# Patient Record
Sex: Female | Born: 1970
Health system: Southern US, Community
[De-identification: ages and names within clinical notes are randomized; demographics above are authoritative.]

## PROBLEM LIST (undated history)

## (undated) DIAGNOSIS — I10 Essential (primary) hypertension: Secondary | ICD-10-CM

## (undated) DIAGNOSIS — N809 Endometriosis, unspecified: Secondary | ICD-10-CM

## (undated) DIAGNOSIS — E049 Nontoxic goiter, unspecified: Secondary | ICD-10-CM

## (undated) DIAGNOSIS — L309 Dermatitis, unspecified: Secondary | ICD-10-CM

## (undated) DIAGNOSIS — J302 Other seasonal allergic rhinitis: Secondary | ICD-10-CM

## (undated) HISTORY — DX: Other seasonal allergic rhinitis: J30.2

## (undated) HISTORY — DX: Dermatitis, unspecified: L30.9

## (undated) HISTORY — DX: Endometriosis, unspecified: N80.9

---

## 2010-07-04 ENCOUNTER — Ambulatory Visit (HOSPITAL_COMMUNITY): Admission: RE | Admit: 2010-07-04 | Discharge: 2010-07-04 | Payer: Self-pay | Admitting: Obstetrics

## 2010-11-19 ENCOUNTER — Inpatient Hospital Stay (HOSPITAL_COMMUNITY)
Admission: AD | Admit: 2010-11-19 | Discharge: 2010-11-19 | Payer: Self-pay | Source: Home / Self Care | Attending: Obstetrics | Admitting: Obstetrics

## 2010-11-20 ENCOUNTER — Inpatient Hospital Stay (HOSPITAL_COMMUNITY)
Admission: AD | Admit: 2010-11-20 | Discharge: 2010-11-20 | Payer: Self-pay | Source: Home / Self Care | Attending: Obstetrics & Gynecology | Admitting: Obstetrics & Gynecology

## 2010-11-27 ENCOUNTER — Inpatient Hospital Stay (HOSPITAL_COMMUNITY)
Admission: RE | Admit: 2010-11-27 | Discharge: 2010-11-30 | Disposition: A | Discharge status: Other Acute Inpatient Hospital | Payer: Self-pay | Source: Home / Self Care | Attending: Obstetrics | Admitting: Obstetrics

## 2010-11-27 ENCOUNTER — Encounter: Payer: Self-pay | Admitting: Obstetrics

## 2010-12-02 LAB — CBC
HCT: 33.8 % — ABNORMAL LOW (ref 36.0–46.0)
HCT: 31.5 % — ABNORMAL LOW (ref 36.0–46.0)
HCT: 27.3 % — ABNORMAL LOW (ref 36.0–46.0)
Hemoglobin: 10.9 g/dL — ABNORMAL LOW (ref 12.0–15.0)
Hemoglobin: 10 g/dL — ABNORMAL LOW (ref 12.0–15.0)
Hemoglobin: 8.7 g/dL — ABNORMAL LOW (ref 12.0–15.0)
MCH: 25.3 pg — ABNORMAL LOW (ref 26.0–34.0)
MCH: 25.3 pg — ABNORMAL LOW (ref 26.0–34.0)
MCH: 25.1 pg — ABNORMAL LOW (ref 26.0–34.0)
MCHC: 32.2 g/dL (ref 30.0–36.0)
MCHC: 31.7 g/dL (ref 30.0–36.0)
MCHC: 31.9 g/dL (ref 30.0–36.0)
MCV: 78.6 fL (ref 78.0–100.0)
MCV: 79.5 fL (ref 78.0–100.0)
MCV: 78.7 fL (ref 78.0–100.0)
Platelets: 216 10*3/uL (ref 150–400)
Platelets: 184 10*3/uL (ref 150–400)
Platelets: 184 10*3/uL (ref 150–400)
RBC: 4.3 MIL/uL (ref 3.87–5.11)
RBC: 3.96 MIL/uL (ref 3.87–5.11)
RBC: 3.47 MIL/uL — ABNORMAL LOW (ref 3.87–5.11)
RDW: 16.4 % — ABNORMAL HIGH (ref 11.5–15.5)
RDW: 16.6 % — ABNORMAL HIGH (ref 11.5–15.5)
RDW: 16.7 % — ABNORMAL HIGH (ref 11.5–15.5)
WBC: 7.9 10*3/uL (ref 4.0–10.5)
WBC: 13.4 10*3/uL — ABNORMAL HIGH (ref 4.0–10.5)
WBC: 13 10*3/uL — ABNORMAL HIGH (ref 4.0–10.5)

## 2010-12-02 LAB — RPR: RPR Ser Ql: NONREACTIVE

## 2010-12-02 LAB — SURGICAL PCR SCREEN
MRSA, PCR: NEGATIVE
Staphylococcus aureus: NEGATIVE

## 2010-12-02 LAB — ABO/RH: ABO/RH(D): O POS

## 2010-12-08 ENCOUNTER — Encounter: Payer: Self-pay | Admitting: Obstetrics

## 2011-01-27 LAB — URINALYSIS, ROUTINE W REFLEX MICROSCOPIC
Bilirubin Urine: NEGATIVE
Glucose, UA: NEGATIVE mg/dL
Ketones, ur: NEGATIVE mg/dL
Nitrite: NEGATIVE
Protein, ur: 100 mg/dL — AB
Specific Gravity, Urine: 1.015 (ref 1.005–1.030)
Urobilinogen, UA: 0.2 mg/dL (ref 0.0–1.0)
pH: 7 (ref 5.0–8.0)

## 2011-01-27 LAB — COMPREHENSIVE METABOLIC PANEL
ALT: 20 U/L (ref 0–35)
AST: 28 U/L (ref 0–37)
Albumin: 2.6 g/dL — ABNORMAL LOW (ref 3.5–5.2)
Alkaline Phosphatase: 156 U/L — ABNORMAL HIGH (ref 39–117)
BUN: 6 mg/dL (ref 6–23)
CO2: 22 mEq/L (ref 19–32)
Calcium: 8.9 mg/dL (ref 8.4–10.5)
Chloride: 103 mEq/L (ref 96–112)
Creatinine, Ser: 0.53 mg/dL (ref 0.4–1.2)
GFR calc Af Amer: >60 mL/min (ref >60)
GFR calc non Af Amer: >60 mL/min (ref >60)
Glucose, Bld: 85 mg/dL (ref 70–99)
Potassium: 3.7 mEq/L (ref 3.5–5.1)
Sodium: 133 mEq/L — ABNORMAL LOW (ref 135–145)
Total Bilirubin: 0.5 mg/dL (ref 0.3–1.2)
Total Protein: 6.7 g/dL (ref 6.0–8.3)

## 2011-01-27 LAB — CBC
HCT: 34.8 % — ABNORMAL LOW (ref 36.0–46.0)
Hemoglobin: 11.1 g/dL — ABNORMAL LOW (ref 12.0–15.0)
MCH: 25.1 pg — ABNORMAL LOW (ref 26.0–34.0)
MCHC: 31.9 g/dL (ref 30.0–36.0)
MCV: 78.7 fL (ref 78.0–100.0)
Platelets: 242 10*3/uL (ref 150–400)
RBC: 4.42 MIL/uL (ref 3.87–5.11)
RDW: 16 % — ABNORMAL HIGH (ref 11.5–15.5)
WBC: 8.2 10*3/uL (ref 4.0–10.5)

## 2011-01-27 LAB — URINE CULTURE
Colony Count: NO GROWTH
Culture  Setup Time: 201201040124
Culture: NO GROWTH

## 2011-01-27 LAB — URINE MICROSCOPIC-ADD ON

## 2011-01-27 LAB — URIC ACID: Uric Acid, Serum: 5.1 mg/dL (ref 2.4–7.0)

## 2011-09-08 ENCOUNTER — Other Ambulatory Visit (HOSPITAL_COMMUNITY): Payer: Self-pay | Admitting: Endocrinology

## 2011-09-08 DIAGNOSIS — E041 Nontoxic single thyroid nodule: Secondary | ICD-10-CM

## 2011-09-15 ENCOUNTER — Other Ambulatory Visit (HOSPITAL_COMMUNITY): Payer: Self-pay

## 2011-09-18 ENCOUNTER — Ambulatory Visit (HOSPITAL_COMMUNITY)
Admission: RE | Admit: 2011-09-18 | Discharge: 2011-09-18 | Disposition: A | Discharge status: Home / Self Care | Payer: BC Managed Care – PPO | Source: Ambulatory Visit | Attending: Endocrinology | Admitting: Endocrinology

## 2011-09-18 DIAGNOSIS — E049 Nontoxic goiter, unspecified: Secondary | ICD-10-CM | POA: Insufficient documentation

## 2011-09-18 DIAGNOSIS — E041 Nontoxic single thyroid nodule: Secondary | ICD-10-CM

## 2011-09-18 DIAGNOSIS — R599 Enlarged lymph nodes, unspecified: Secondary | ICD-10-CM | POA: Insufficient documentation

## 2011-12-29 ENCOUNTER — Other Ambulatory Visit (HOSPITAL_COMMUNITY): Payer: Self-pay | Admitting: Endocrinology

## 2011-12-29 ENCOUNTER — Other Ambulatory Visit: Payer: Self-pay | Admitting: Endocrinology

## 2011-12-29 DIAGNOSIS — E041 Nontoxic single thyroid nodule: Secondary | ICD-10-CM

## 2012-01-09 ENCOUNTER — Ambulatory Visit (HOSPITAL_COMMUNITY)
Admission: RE | Admit: 2012-01-09 | Discharge: 2012-01-09 | Disposition: A | Discharge status: Home / Self Care | Payer: BC Managed Care – PPO | Source: Ambulatory Visit | Attending: Endocrinology | Admitting: Endocrinology

## 2012-01-09 DIAGNOSIS — E041 Nontoxic single thyroid nodule: Secondary | ICD-10-CM | POA: Insufficient documentation

## 2012-01-09 NOTE — Procedures (Signed)
Procedure : FNA of left lower pole thyroid nodule with 25 gauge needle x 4 Complications : none immediate  Sample sent to lab for analysis

## 2012-01-16 IMAGING — US US SOFT TISSUE HEAD/NECK
1 series · 14 of 25 positions shown · non-contrast
Comparison: None.

CLINICAL DATA: Solitary nontoxic goiter on the left.

THYROID ULTRASOUND
TECHNIQUE: Ultrasound examination of the thyroid gland and adjacent
soft tissues was performed.

[Series 1: us soft tissue head/neck · 0.08mm/px · 14 of 53 slices shown]
[im 1/53]
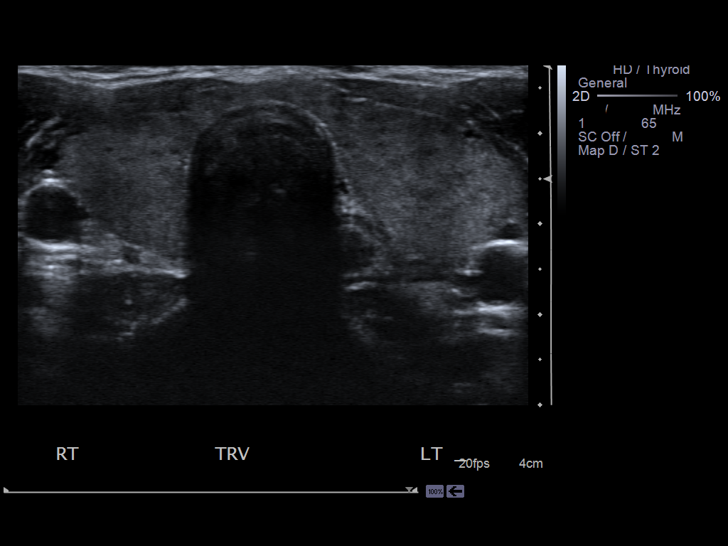
[im 5/53]
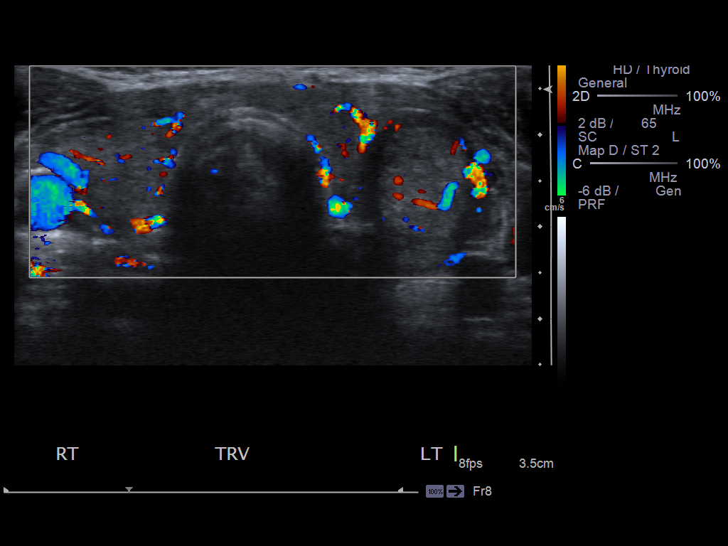
[im 9/53]
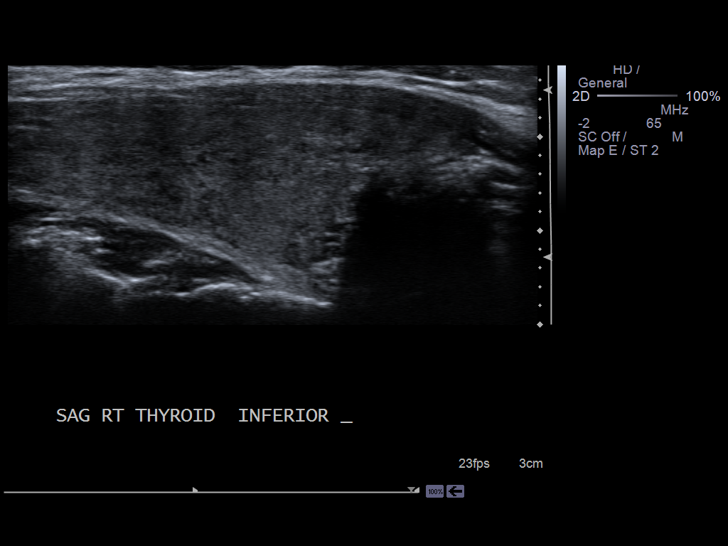
[im 14/53]
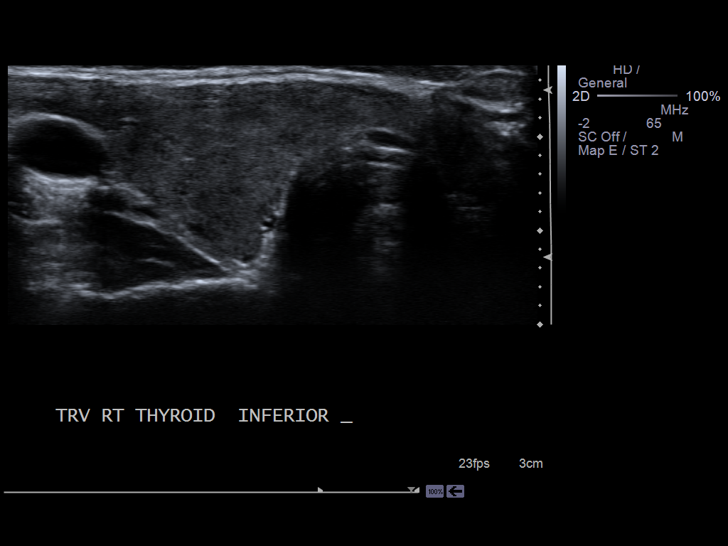
[im 18/53]
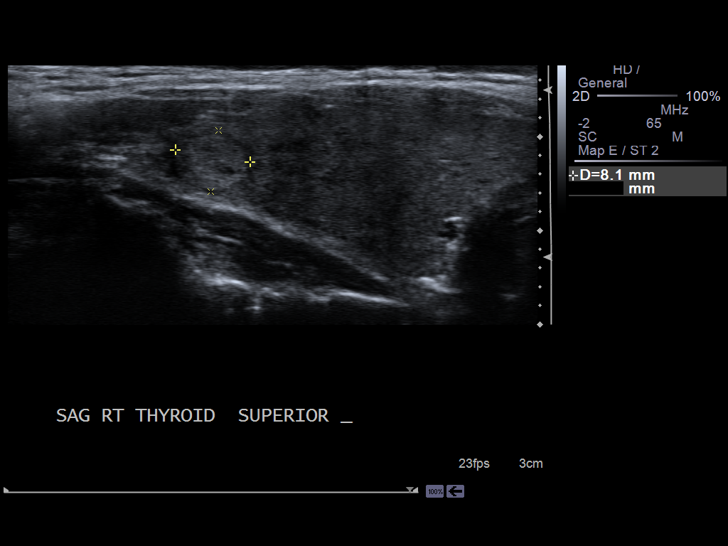
[im 20/53]
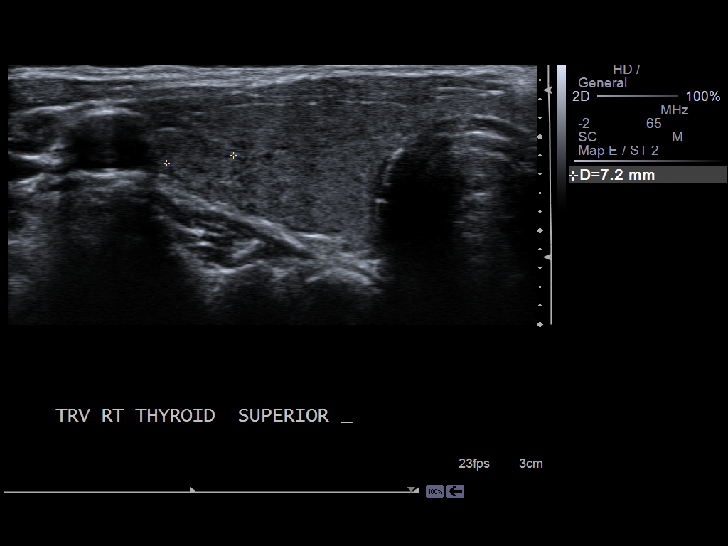
[im 24/53]
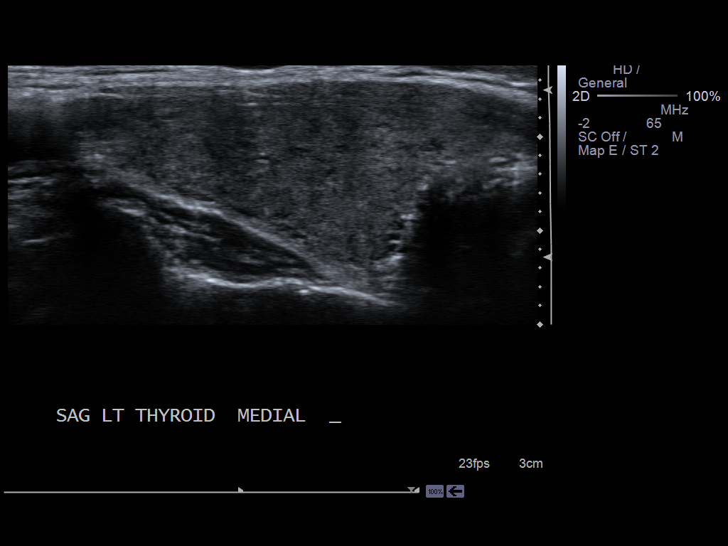
[im 29/53]
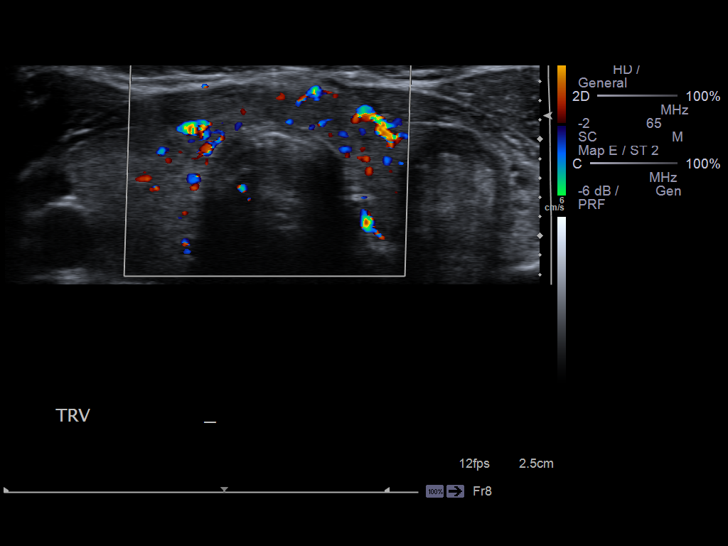
[im 33/53]
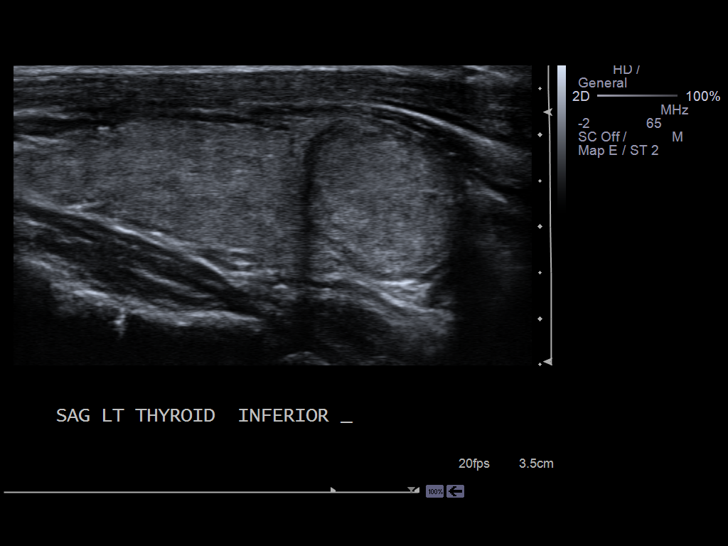
[im 35/53]
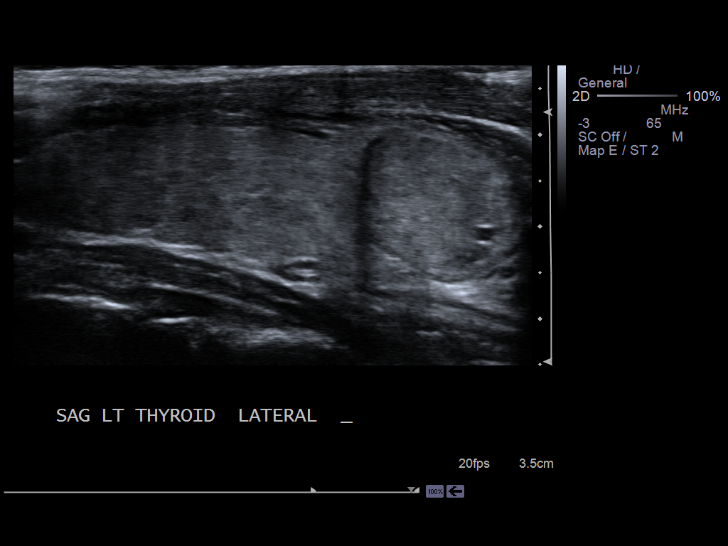
[im 40/53]
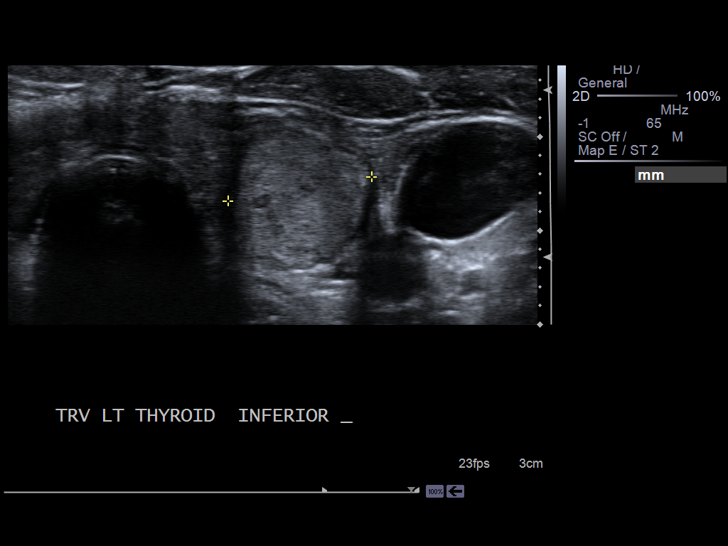
[im 44/53]
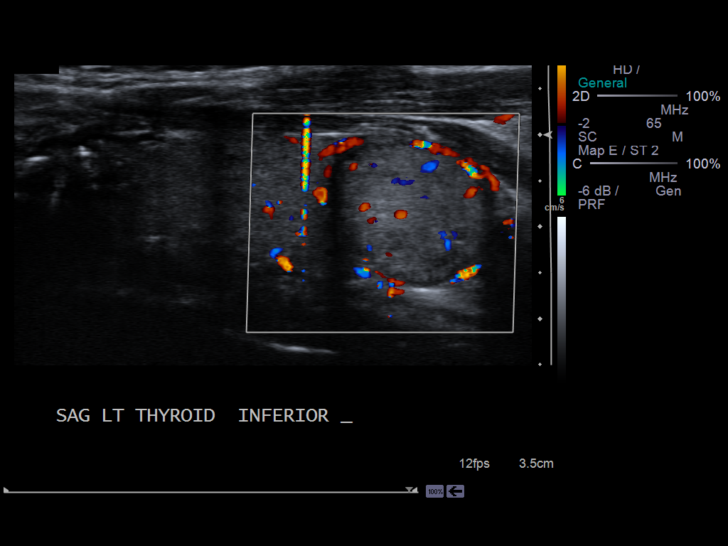
[im 48/53]
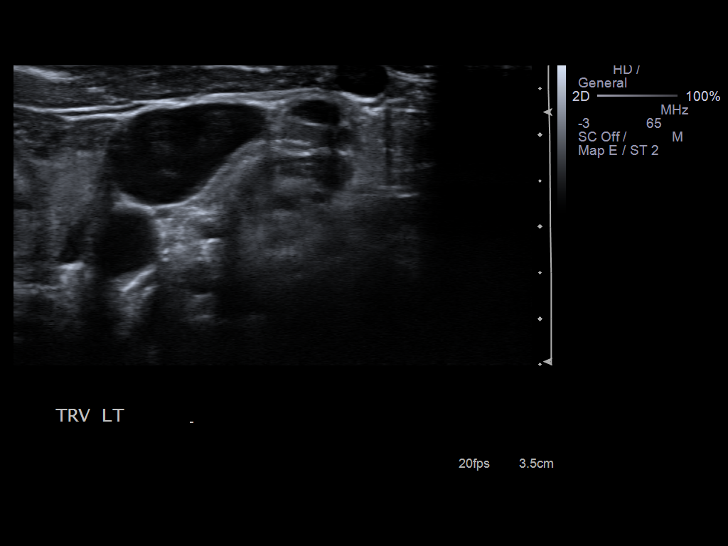
[im 53/53]
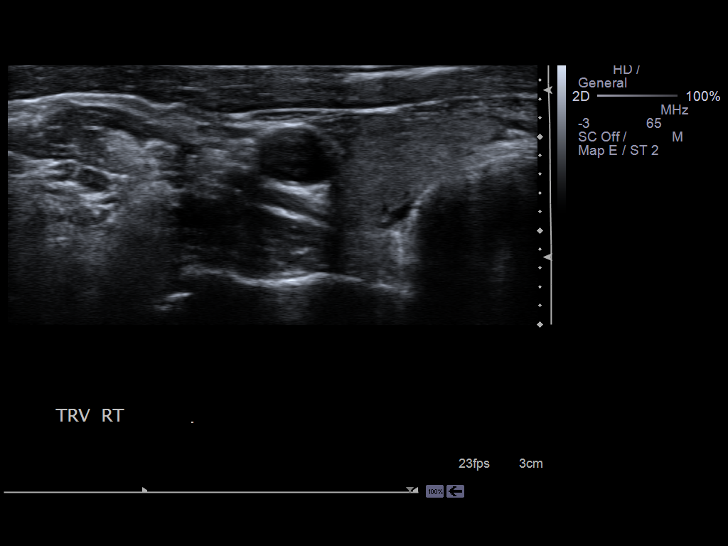

[14 of 25 positions shown; findings below may reference images not displayed]

FINDINGS: Right thyroid lobe:  4.9 x 1.9 x 2.3 cm.
Left thyroid lobe:  5.5 x 1.6 x 2.5 cm.
Isthmus:  0.35 cm.

Focal nodules:  Solid appearing right upper pole 0.8 x 0.7 x 0.7 cm
nodule.

Solid appearing left lower pole 1.9 x 1.7 x 1.6 cm nodule which may
contain small calcifications and tiny cyst.

Lymphadenopathy:  None visualized.
IMPRESSION: Solid lesions within each lobe, larger on the left.  Fine-needle
aspirate may be considered to help exclude malignancy.

## 2013-02-25 ENCOUNTER — Ambulatory Visit: Payer: Self-pay

## 2013-02-25 ENCOUNTER — Other Ambulatory Visit: Payer: Self-pay | Admitting: Occupational Medicine

## 2013-02-25 DIAGNOSIS — R7612 Nonspecific reaction to cell mediated immunity measurement of gamma interferon antigen response without active tuberculosis: Secondary | ICD-10-CM

## 2013-11-24 ENCOUNTER — Other Ambulatory Visit: Payer: Self-pay | Admitting: Endocrinology

## 2013-11-24 DIAGNOSIS — E041 Nontoxic single thyroid nodule: Secondary | ICD-10-CM

## 2013-11-30 ENCOUNTER — Other Ambulatory Visit: Payer: Self-pay

## 2013-12-03 ENCOUNTER — Emergency Department (HOSPITAL_COMMUNITY): Payer: BC Managed Care – PPO

## 2013-12-03 ENCOUNTER — Emergency Department (HOSPITAL_COMMUNITY)
Admission: EM | Admit: 2013-12-03 | Discharge: 2013-12-04 | Disposition: A | Discharge status: Home / Self Care | Payer: BC Managed Care – PPO | Attending: Emergency Medicine | Admitting: Emergency Medicine

## 2013-12-03 ENCOUNTER — Encounter (HOSPITAL_COMMUNITY): Payer: Self-pay | Admitting: Emergency Medicine

## 2013-12-03 DIAGNOSIS — R1905 Periumbilic swelling, mass or lump: Secondary | ICD-10-CM | POA: Insufficient documentation

## 2013-12-03 DIAGNOSIS — R1909 Other intra-abdominal and pelvic swelling, mass and lump: Secondary | ICD-10-CM

## 2013-12-03 DIAGNOSIS — Z3202 Encounter for pregnancy test, result negative: Secondary | ICD-10-CM | POA: Insufficient documentation

## 2013-12-03 DIAGNOSIS — E049 Nontoxic goiter, unspecified: Secondary | ICD-10-CM | POA: Insufficient documentation

## 2013-12-03 DIAGNOSIS — Z9889 Other specified postprocedural states: Secondary | ICD-10-CM | POA: Insufficient documentation

## 2013-12-03 HISTORY — DX: Nontoxic goiter, unspecified: E04.9

## 2013-12-03 LAB — CBC WITH DIFFERENTIAL/PLATELET
BASOS PCT: 1 % (ref 0–1)
Basophils Absolute: 0.1 10*3/uL (ref 0.0–0.1)
EOS ABS: 0.1 10*3/uL (ref 0.0–0.7)
Eosinophils Relative: 2 % (ref 0–5)
HEMATOCRIT: 34.6 % — AB (ref 36.0–46.0)
Hemoglobin: 11.3 g/dL — ABNORMAL LOW (ref 12.0–15.0)
Lymphocytes Relative: 29 % (ref 12–46)
Lymphs Abs: 2.5 10*3/uL (ref 0.7–4.0)
MCH: 25.9 pg — AB (ref 26.0–34.0)
MCHC: 32.7 g/dL (ref 30.0–36.0)
MCV: 79.2 fL (ref 78.0–100.0)
MONO ABS: 1 10*3/uL (ref 0.1–1.0)
Monocytes Relative: 12 % (ref 3–12)
NEUTROS ABS: 5 10*3/uL (ref 1.7–7.7)
Neutrophils Relative %: 57 % (ref 43–77)
Platelets: 267 10*3/uL (ref 150–400)
RBC: 4.37 MIL/uL (ref 3.87–5.11)
RDW: 15.5 % (ref 11.5–15.5)
WBC: 8.8 10*3/uL (ref 4.0–10.5)

## 2013-12-03 LAB — COMPREHENSIVE METABOLIC PANEL
ALBUMIN: 3.6 g/dL (ref 3.5–5.2)
ALK PHOS: 57 U/L (ref 39–117)
ALT: 14 U/L (ref 0–35)
AST: 19 U/L (ref 0–37)
BILIRUBIN TOTAL: 0.2 mg/dL — AB (ref 0.3–1.2)
BUN: 13 mg/dL (ref 6–23)
CHLORIDE: 99 meq/L (ref 96–112)
CO2: 26 mEq/L (ref 19–32)
CREATININE: 0.56 mg/dL (ref 0.50–1.10)
Calcium: 9 mg/dL (ref 8.4–10.5)
GFR calc non Af Amer: >90 mL/min (ref >90)
Glucose, Bld: 112 mg/dL — ABNORMAL HIGH (ref 70–99)
POTASSIUM: 3.7 meq/L (ref 3.7–5.3)
Sodium: 137 mEq/L (ref 137–147)
Total Protein: 8.5 g/dL — ABNORMAL HIGH (ref 6.0–8.3)

## 2013-12-03 LAB — POCT PREGNANCY, URINE: Preg Test, Ur: NEGATIVE

## 2013-12-03 MED ORDER — IOHEXOL 300 MG/ML  SOLN
50.0000 mL | Freq: Once | INTRAMUSCULAR | Status: AC | PRN
  Administered 2013-12-03: 50 mL via ORAL

## 2013-12-03 MED ORDER — DIPHENHYDRAMINE HCL 50 MG/ML IJ SOLN
25.0000 mg | Freq: Once | INTRAMUSCULAR | Status: AC
  Administered 2013-12-03: 25 mg via INTRAVENOUS
  Filled 2013-12-03: qty 1

## 2013-12-03 MED ORDER — METHYLPREDNISOLONE SODIUM SUCC 125 MG IJ SOLR
125.0000 mg | Freq: Once | INTRAMUSCULAR | Status: AC
  Administered 2013-12-03: 125 mg via INTRAVENOUS
  Filled 2013-12-03: qty 2

## 2013-12-03 NOTE — ED Notes (Signed)
Patient unable to urinate at this time. 

## 2013-12-03 NOTE — ED Notes (Signed)
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

## 2013-12-03 NOTE — ED Provider Notes (Signed)
CSN: [id number]     Arrival date & time 12/03/13  2136 History   First MD Initiated Contact with Patient 12/03/13 2211     Chief Complaint  Patient presents with  . Abdominal Pain   (Consider location/radiation/quality/duration/timing/severity/associated sxs/prior Treatment) The history is provided by the patient and medical records. No language interpreter was used.    Alexis Fleming is a 43 y.o. female  with a hx of C-section, thyroid goiter presents to the Emergency Department complaining of gradual, persistent, progressively worsening right lower quadrant abdominal pain onset several months ago.  She reports the pain has been persistent and nagging but acutely worsens over the last 3 days peaking today after a 12 hour shift at work.  Patient reports she had her last cesarean section 2 years ago. Today when the pain was at its worst she was palpating the incision and palpated a mass in the right lower quadrant.  Patient reports family history of colon cancer. She reports her mother died at the age of 72 with colon cancer this is her concern.  Nothing makes symptoms better or worse. She's not tried any over-the-counter treatments. She denies fever, chills, headache, neck pain, chest pain shortness of breath, nausea, vomiting, diarrhea, weakness, dizziness, syncope, dysuria, hematuria. She reports her last bowel movement was today and was normal.    Past Medical History  Diagnosis Date  . Thyroid goiter     bipsy done here 2013   Past Surgical History  Procedure Laterality Date  . Cesarean section     No family history on file. History  Substance Use Topics  . Smoking status: Never Smoker   . Smokeless tobacco: Not on file  . Alcohol Use: No   OB History   Grav Para Term Preterm Abortions TAB SAB Ect Mult Living                 Review of Systems  Constitutional: Negative for fever, diaphoresis, appetite change, fatigue and unexpected weight change.  HENT: Negative for mouth  sores.   Eyes: Negative for visual disturbance.  Respiratory: Negative for cough, chest tightness, shortness of breath and wheezing.   Cardiovascular: Negative for chest pain.  Gastrointestinal: Positive for abdominal pain. Negative for nausea, vomiting, diarrhea and constipation.  Endocrine: Negative for polydipsia, polyphagia and polyuria.  Genitourinary: Negative for dysuria, urgency, frequency and hematuria.  Musculoskeletal: Negative for back pain and neck stiffness.  Skin: Negative for rash.  Allergic/Immunologic: Negative for immunocompromised state.  Neurological: Negative for syncope, light-headedness and headaches.  Hematological: Does not bruise/bleed easily.  Psychiatric/Behavioral: Negative for sleep disturbance. The patient is not nervous/anxious.     Allergies  Shellfish allergy and Betadine  Home Medications  No current outpatient prescriptions on file. BP 142/78  Pulse 93  Temp(Src) 98.2 F (36.8 C) (Oral)  Resp 15  Ht 5\' 4"  (1.626 m)  Wt 152 lb 9.6 oz (69.219 kg)  BMI 26.18 kg/m2  SpO2 99%  LMP 11/18/2013 Physical Exam  Nursing note and vitals reviewed. Constitutional: She is oriented to person, place, and time. She appears well-developed and well-nourished.  HENT:  Head: Normocephalic and atraumatic.  Mouth/Throat: Oropharynx is clear and moist.  Eyes: Conjunctivae are normal. No scleral icterus.  Neck: Normal range of motion. Thyromegaly (palpable small goiter) present.  Cardiovascular: Normal rate, regular rhythm, normal heart sounds and intact distal pulses.   No murmur heard. No tachycardia  Pulmonary/Chest: Effort normal and breath sounds normal. No respiratory distress. She has no wheezes.  Abdominal: Soft. Bowel sounds are normal. She exhibits no distension and no mass. There is tenderness in the periumbilical area. There is guarding. There is no rebound and no CVA tenderness.  Periumbilical abdominal tenderness with 4 x 4 cm palpable mass at the  7' O'Clock position in the periumbilical region.   Guarding but no rebound; no peritoneal signs  Musculoskeletal: Normal range of motion. She exhibits no tenderness.  Lymphadenopathy:    She has no cervical adenopathy.  Neurological: She is alert and oriented to person, place, and time. She exhibits normal muscle tone. Coordination normal.  Skin: Skin is warm and dry. No erythema.  Psychiatric: She has a normal mood and affect. Her behavior is normal.    ED Course  Procedures (including critical care time) Labs Review Labs Reviewed  CBC WITH DIFFERENTIAL - Abnormal; Notable for the following:    Hemoglobin 11.3 (*)    HCT 34.6 (*)    MCH 25.9 (*)    All other components within normal limits  COMPREHENSIVE METABOLIC PANEL - Abnormal; Notable for the following:    Glucose, Bld 112 (*)    Total Protein 8.5 (*)    Total Bilirubin 0.2 (*)    All other components within normal limits  URINALYSIS, ROUTINE W REFLEX MICROSCOPIC  POCT PREGNANCY, URINE   Imaging Review No results found.  EKG Interpretation   None       MDM   1. Abdominal pain      ISOBELLA ASCHER presents with periumbilical and right lower quadrant pain for many months, now with small mass right lower in the region. CBC, CMP unremarkable.  UA without evidence of urinary tract infection. Pregnancy test negative.  No pelvic pain on exam.  1:06 AM Pt CT pending.  She continues to refuse narcotic pain medication, but as given Toradol.  Discussed with Barton Dubois, PA-C who will follow and dispo.    BP 142/78  Pulse 93  Temp(Src) 98.2 F (36.8 C) (Oral)  Resp 15  Ht 5\' 4"  (1.626 m)  Wt 152 lb 9.6 oz (69.219 kg)  BMI 26.18 kg/m2  SpO2 99%  LMP 11/18/2013   Abigail Butts, PA-C 12/04/13 0106

## 2013-12-04 LAB — URINALYSIS, ROUTINE W REFLEX MICROSCOPIC
Bilirubin Urine: NEGATIVE
Glucose, UA: NEGATIVE mg/dL
Hgb urine dipstick: NEGATIVE
Ketones, ur: NEGATIVE mg/dL
Leukocytes, UA: NEGATIVE
Nitrite: NEGATIVE
Protein, ur: NEGATIVE mg/dL
Specific Gravity, Urine: 1.014 (ref 1.005–1.030)
Urobilinogen, UA: 0.2 mg/dL (ref 0.0–1.0)
pH: 6.5 (ref 5.0–8.0)

## 2013-12-04 MED ORDER — IOHEXOL 300 MG/ML  SOLN
100.0000 mL | Freq: Once | INTRAMUSCULAR | Status: AC | PRN
  Administered 2013-12-04: 100 mL via INTRAVENOUS

## 2013-12-04 MED ORDER — KETOROLAC TROMETHAMINE 30 MG/ML IJ SOLN
30.0000 mg | Freq: Once | INTRAMUSCULAR | Status: AC
  Administered 2013-12-04: 30 mg via INTRAVENOUS
  Filled 2013-12-04: qty 1

## 2013-12-04 NOTE — ED Provider Notes (Signed)
Medical screening examination/treatment/procedure(s) were performed by non-physician practitioner and as supervising physician I was immediately available for consultation/collaboration.  EKG Interpretation   None         Kadeisha Betsch T Abe Schools, MD 12/04/13 1504 

## 2013-12-04 NOTE — Discharge Instructions (Signed)
Follow up with the general surgeon for further evaluation of mass in abdominal wall fat.  Follow up with gastroenterology for colonoscopy and your primary care doctor to discuss lung findings on Cat Scan.  Return to the ER if you develop fever, your pain worsens or you have any other concerning symptoms.

## 2013-12-04 NOTE — ED Notes (Signed)
PA at bedside.

## 2013-12-16 ENCOUNTER — Other Ambulatory Visit: Payer: Self-pay

## 2013-12-16 ENCOUNTER — Ambulatory Visit (INDEPENDENT_AMBULATORY_CARE_PROVIDER_SITE_OTHER): Payer: BC Managed Care – PPO | Admitting: General Surgery

## 2013-12-23 ENCOUNTER — Ambulatory Visit
Admission: RE | Admit: 2013-12-23 | Discharge: 2013-12-23 | Disposition: A | Discharge status: Home / Self Care | Payer: BC Managed Care – PPO | Source: Ambulatory Visit | Attending: Endocrinology | Admitting: Endocrinology

## 2013-12-23 DIAGNOSIS — E041 Nontoxic single thyroid nodule: Secondary | ICD-10-CM

## 2013-12-28 ENCOUNTER — Ambulatory Visit (INDEPENDENT_AMBULATORY_CARE_PROVIDER_SITE_OTHER): Payer: BC Managed Care – PPO | Admitting: General Surgery

## 2013-12-28 ENCOUNTER — Encounter (INDEPENDENT_AMBULATORY_CARE_PROVIDER_SITE_OTHER): Payer: Self-pay | Admitting: General Surgery

## 2013-12-28 VITALS — BP 122/68 | HR 72 | Temp 98.0°F | Resp 18 | Ht 64.0 in | Wt 150.0 lb

## 2013-12-28 DIAGNOSIS — R1903 Right lower quadrant abdominal swelling, mass and lump: Secondary | ICD-10-CM

## 2013-12-28 DIAGNOSIS — R1901 Right upper quadrant abdominal swelling, mass and lump: Secondary | ICD-10-CM | POA: Insufficient documentation

## 2013-12-28 NOTE — Progress Notes (Signed)
Patient ID: Alexis Fleming, female   DOB: 24-Mar-1971, 43 y.o.   MRN: [id number]  Chief Complaint  Patient presents with  . New Evaluation    wound    HPI Alexis Fleming is a 43 y.o. female.  We're asked to see the patient in consultation by Dr. Sherwood Gambler to evaluate her for an abdominal wall mass. The patient is a 43 year old female who first felt a mass in her right lower quadrant about 2 months ago. The pain has gotten worse since she first noticed it. She feels as though the mass is gotten a little bit larger since she first noticed it. She denies any nausea or vomiting. She denies any fevers or chills. Her appetite is good no bowels are working normally. She had a CT scan that shows a 1.6 cm mass in the subcutaneous tissue just on top of the abdominal wall muscles in the right lower quadrant.  HPI  Past Medical History  Diagnosis Date  . Thyroid goiter     bipsy done here 2013    Past Surgical History  Procedure Laterality Date  . Cesarean section  2003/2012    Family History  Problem Relation Age of Onset  . Cancer Mother     colon ca  . Heart disease Father     Social History History  Substance Use Topics  . Smoking status: Never Smoker   . Smokeless tobacco: Not on file  . Alcohol Use: No    Allergies  Allergen Reactions  . Shellfish Allergy Anaphylaxis and Swelling  . Betadine [Povidone Iodine] Rash    Leaves rash on body    Current Outpatient Prescriptions  Medication Sig Dispense Refill  . ibuprofen (ADVIL,MOTRIN) 200 MG tablet Take 200 mg by mouth every 6 (six) hours as needed.       No current facility-administered medications for this visit.    Review of Systems Review of Systems  Constitutional: Negative.   HENT: Negative.   Eyes: Negative.   Respiratory: Negative.   Cardiovascular: Negative.   Gastrointestinal: Negative.   Endocrine: Negative.   Genitourinary: Negative.   Musculoskeletal: Negative.   Skin: Negative.    Allergic/Immunologic: Negative.   Neurological: Negative.   Hematological: Negative.   Psychiatric/Behavioral: Negative.     Blood pressure 122/68, pulse 72, temperature 98 F (36.7 C), resp. rate 18, height 5\' 4"  (1.626 m), weight 150 lb (68.04 kg), last menstrual period 11/18/2013.  Physical Exam Physical Exam  Constitutional: She is oriented to person, place, and time. She appears well-developed and well-nourished.  HENT:  Head: Normocephalic and atraumatic.  Eyes: Conjunctivae and EOM are normal. Pupils are equal, round, and reactive to light.  Neck: Normal range of motion. Neck supple.  Cardiovascular: Normal rate, regular rhythm and normal heart sounds.   Pulmonary/Chest: Effort normal and breath sounds normal.  Abdominal: Soft. Bowel sounds are normal.  There is a palpable mass that feels as though it is about 1-1/2 cm in the right lower quadrant just superior to the edge of her C-section incision  Musculoskeletal: Normal range of motion.  Lymphadenopathy:    She has no cervical adenopathy.  Neurological: She is alert and oriented to person, place, and time.  Skin: Skin is warm and dry.  Psychiatric: She has a normal mood and affect. Her behavior is normal.    Data Reviewed As above  Assessment    The patient appears to have an abdominal wall mass and a right lower quadrant that is painful  and enlarging. Because of this I think it would be reasonable to remove this. I discussed with her in detail the risks and benefits of the operation to remove this mass as well as some of the technical aspects and she understands and wishes to proceed     Plan    Plan for excision of the right lower quadrant abdominal wall mass        TOTH III,PAUL S 12/28/2013, 2:25 PM

## 2014-01-12 ENCOUNTER — Encounter (HOSPITAL_COMMUNITY): Payer: Self-pay | Admitting: *Deleted

## 2014-01-12 NOTE — Progress Notes (Signed)
Patient uses for pharmacy [facility] on [address] in [place].

## 2014-01-19 NOTE — Anesthesia Preprocedure Evaluation (Addendum)
Anesthesia Evaluation  Patient identified by MRN, date of birth, ID band Patient awake    Reviewed: Allergy & Precautions, H&P , NPO status , Patient's Chart, lab work & pertinent test results  Airway Mallampati: II TM Distance: >3 FB Neck ROM: Full    Dental  (+) Teeth Intact, Dental Advisory Given   Pulmonary neg pulmonary ROS,  breath sounds clear to auscultation  Pulmonary exam normal       Cardiovascular hypertension, Rhythm:Regular Rate:Normal     Neuro/Psych negative neurological ROS  negative psych ROS   GI/Hepatic negative GI ROS, Neg liver ROS,   Endo/Other  negative endocrine ROS  Renal/GU negative Renal ROS  negative genitourinary   Musculoskeletal negative musculoskeletal ROS (+)   Abdominal   Peds  Hematology negative hematology ROS (+)   Anesthesia Other Findings   Reproductive/Obstetrics                          Anesthesia Physical Anesthesia Plan  ASA: II  Anesthesia Plan: General   Post-op Pain Management:    Induction: Intravenous  Airway Management Planned: Oral ETT and LMA  Additional Equipment:   Intra-op Plan:   Post-operative Plan: Extubation in OR  Informed Consent: I have reviewed the patients History and Physical, chart, labs and discussed the procedure including the risks, benefits and alternatives for the proposed anesthesia with the patient or authorized representative who has indicated his/her understanding and acceptance.   Dental advisory given  Plan Discussed with: CRNA  Anesthesia Plan Comments:        Anesthesia Quick Evaluation

## 2014-01-20 ENCOUNTER — Encounter (HOSPITAL_COMMUNITY): Payer: BC Managed Care – PPO | Admitting: Certified Registered Nurse Anesthetist

## 2014-01-20 ENCOUNTER — Encounter (HOSPITAL_COMMUNITY): Payer: Self-pay | Admitting: *Deleted

## 2014-01-20 ENCOUNTER — Encounter (HOSPITAL_COMMUNITY)
Admission: RE | Disposition: A | Discharge status: Home / Self Care | Payer: Self-pay | Source: Ambulatory Visit | Attending: General Surgery

## 2014-01-20 ENCOUNTER — Ambulatory Visit (HOSPITAL_COMMUNITY): Payer: BC Managed Care – PPO | Admitting: Certified Registered Nurse Anesthetist

## 2014-01-20 ENCOUNTER — Ambulatory Visit (HOSPITAL_COMMUNITY)
Admission: RE | Admit: 2014-01-20 | Discharge: 2014-01-20 | Disposition: A | Discharge status: Home / Self Care | Payer: BC Managed Care – PPO | Source: Ambulatory Visit | Attending: General Surgery | Admitting: General Surgery

## 2014-01-20 DIAGNOSIS — R1903 Right lower quadrant abdominal swelling, mass and lump: Secondary | ICD-10-CM

## 2014-01-20 DIAGNOSIS — N806 Endometriosis in cutaneous scar: Secondary | ICD-10-CM

## 2014-01-20 DIAGNOSIS — I10 Essential (primary) hypertension: Secondary | ICD-10-CM | POA: Insufficient documentation

## 2014-01-20 DIAGNOSIS — R1909 Other intra-abdominal and pelvic swelling, mass and lump: Secondary | ICD-10-CM | POA: Insufficient documentation

## 2014-01-20 DIAGNOSIS — N808 Other endometriosis: Secondary | ICD-10-CM | POA: Insufficient documentation

## 2014-01-20 HISTORY — DX: Essential (primary) hypertension: I10

## 2014-01-20 HISTORY — PX: IRRIGATION AND DEBRIDEMENT ABSCESS: SHX5252

## 2014-01-20 LAB — CBC
HCT: 34.7 % — ABNORMAL LOW (ref 36.0–46.0)
Hemoglobin: 11.5 g/dL — ABNORMAL LOW (ref 12.0–15.0)
MCH: 26.6 pg (ref 26.0–34.0)
MCHC: 33.1 g/dL (ref 30.0–36.0)
MCV: 80.1 fL (ref 78.0–100.0)
Platelets: 258 10*3/uL (ref 150–400)
RBC: 4.33 MIL/uL (ref 3.87–5.11)
RDW: 15.7 % — AB (ref 11.5–15.5)
WBC: 6.7 10*3/uL (ref 4.0–10.5)

## 2014-01-20 LAB — HCG, SERUM, QUALITATIVE: PREG SERUM: NEGATIVE

## 2014-01-20 SURGERY — IRRIGATION AND DEBRIDEMENT ABSCESS
Anesthesia: General | Site: Abdomen | Laterality: Right

## 2014-01-20 MED ORDER — BUPIVACAINE-EPINEPHRINE 0.25% -1:200000 IJ SOLN
INTRAMUSCULAR | Status: DC | PRN
  Administered 2014-01-20: 20 mL

## 2014-01-20 MED ORDER — 0.9 % SODIUM CHLORIDE (POUR BTL) OPTIME
TOPICAL | Status: DC | PRN
  Administered 2014-01-20: 1000 mL

## 2014-01-20 MED ORDER — SODIUM CHLORIDE 0.9 % IJ SOLN
INTRAMUSCULAR | Status: AC
  Filled 2014-01-20: qty 10

## 2014-01-20 MED ORDER — LACTATED RINGERS IV SOLN: INTRAVENOUS | Status: DC

## 2014-01-20 MED ORDER — LIDOCAINE HCL (CARDIAC) 20 MG/ML IV SOLN
INTRAVENOUS | Status: DC | PRN
  Administered 2014-01-20: 70 mg via INTRAVENOUS

## 2014-01-20 MED ORDER — PROPOFOL 10 MG/ML IV BOLUS
INTRAVENOUS | Status: AC
Start: 2014-01-20 — End: 2014-01-20
  Filled 2014-01-20: qty 20

## 2014-01-20 MED ORDER — CHLORHEXIDINE GLUCONATE 4 % EX LIQD: 1.0000 "application " | Freq: Once | CUTANEOUS | Status: DC

## 2014-01-20 MED ORDER — OXYCODONE-ACETAMINOPHEN 5-325 MG PO TABS
1.0000 | ORAL_TABLET | Freq: Once | ORAL | Status: AC
  Administered 2014-01-20: 1 via ORAL
  Filled 2014-01-20: qty 1

## 2014-01-20 MED ORDER — LACTATED RINGERS IV SOLN
INTRAVENOUS | Status: DC | PRN
  Administered 2014-01-20 (×2): via INTRAVENOUS

## 2014-01-20 MED ORDER — ATROPINE SULFATE 0.4 MG/ML IJ SOLN
INTRAMUSCULAR | Status: AC
  Filled 2014-01-20: qty 1

## 2014-01-20 MED ORDER — MIDAZOLAM HCL 5 MG/5ML IJ SOLN
INTRAMUSCULAR | Status: DC | PRN
  Administered 2014-01-20: 2 mg via INTRAVENOUS

## 2014-01-20 MED ORDER — CEFAZOLIN SODIUM-DEXTROSE 2-3 GM-% IV SOLR
2.0000 g | INTRAVENOUS | Status: AC
  Administered 2014-01-20: 2 g via INTRAVENOUS

## 2014-01-20 MED ORDER — DEXAMETHASONE SODIUM PHOSPHATE 10 MG/ML IJ SOLN
INTRAMUSCULAR | Status: DC | PRN
  Administered 2014-01-20: 10 mg via INTRAVENOUS

## 2014-01-20 MED ORDER — BUPIVACAINE-EPINEPHRINE PF 0.25-1:200000 % IJ SOLN
INTRAMUSCULAR | Status: AC
  Filled 2014-01-20: qty 30

## 2014-01-20 MED ORDER — FENTANYL CITRATE 0.05 MG/ML IJ SOLN
INTRAMUSCULAR | Status: AC
  Filled 2014-01-20: qty 5

## 2014-01-20 MED ORDER — ONDANSETRON HCL 4 MG/2ML IJ SOLN
INTRAMUSCULAR | Status: AC
  Filled 2014-01-20: qty 2

## 2014-01-20 MED ORDER — CEFAZOLIN SODIUM-DEXTROSE 2-3 GM-% IV SOLR
INTRAVENOUS | Status: AC
  Filled 2014-01-20: qty 50

## 2014-01-20 MED ORDER — EPHEDRINE SULFATE 50 MG/ML IJ SOLN
INTRAMUSCULAR | Status: AC
  Filled 2014-01-20: qty 1

## 2014-01-20 MED ORDER — LIDOCAINE HCL 1 % IJ SOLN
INTRAMUSCULAR | Status: AC
  Filled 2014-01-20: qty 20

## 2014-01-20 MED ORDER — HYDROMORPHONE HCL PF 1 MG/ML IJ SOLN: 0.2500 mg | INTRAMUSCULAR | Status: DC | PRN

## 2014-01-20 MED ORDER — DEXAMETHASONE SODIUM PHOSPHATE 10 MG/ML IJ SOLN
INTRAMUSCULAR | Status: AC
  Filled 2014-01-20: qty 1

## 2014-01-20 MED ORDER — PROMETHAZINE HCL 25 MG/ML IJ SOLN: 6.2500 mg | INTRAMUSCULAR | Status: DC | PRN

## 2014-01-20 MED ORDER — PROPOFOL 10 MG/ML IV BOLUS
INTRAVENOUS | Status: AC
  Filled 2014-01-20: qty 20

## 2014-01-20 MED ORDER — OXYCODONE-ACETAMINOPHEN 5-325 MG PO TABS: 1.0000 | ORAL_TABLET | ORAL | Status: DC | PRN

## 2014-01-20 MED ORDER — MIDAZOLAM HCL 2 MG/2ML IJ SOLN
INTRAMUSCULAR | Status: AC
  Filled 2014-01-20: qty 2

## 2014-01-20 MED ORDER — PROPOFOL 10 MG/ML IV BOLUS
INTRAVENOUS | Status: DC | PRN
  Administered 2014-01-20: 180 mg via INTRAVENOUS

## 2014-01-20 MED ORDER — FENTANYL CITRATE 0.05 MG/ML IJ SOLN
INTRAMUSCULAR | Status: DC | PRN
  Administered 2014-01-20 (×2): 50 ug via INTRAVENOUS

## 2014-01-20 MED ORDER — ONDANSETRON HCL 4 MG/2ML IJ SOLN
INTRAMUSCULAR | Status: DC | PRN
  Administered 2014-01-20: 4 mg via INTRAVENOUS

## 2014-01-20 MED ORDER — LIDOCAINE HCL (CARDIAC) 20 MG/ML IV SOLN
INTRAVENOUS | Status: AC
  Filled 2014-01-20: qty 5

## 2014-01-20 SURGICAL SUPPLY — 39 items
BENZOIN TINCTURE PRP APPL 2/3 (GAUZE/BANDAGES/DRESSINGS) IMPLANT
BLADE HEX COATED 2.75 (ELECTRODE) ×2 IMPLANT
BLADE SURG SZ10 CARB STEEL (BLADE) ×2 IMPLANT
CANISTER SUCTION 2500CC (MISCELLANEOUS) ×2 IMPLANT
DECANTER SPIKE VIAL GLASS SM (MISCELLANEOUS) IMPLANT
DERMABOND ADVANCED (GAUZE/BANDAGES/DRESSINGS) ×1
DERMABOND ADVANCED .7 DNX12 (GAUZE/BANDAGES/DRESSINGS) ×1 IMPLANT
DRAPE LAPAROTOMY T 102X78X121 (DRAPES) ×2 IMPLANT
DRAPE LAPAROTOMY TRNSV 102X78 (DRAPE) IMPLANT
DRAPE LG THREE QUARTER DISP (DRAPES) IMPLANT
ELECT REM PT RETURN 9FT ADLT (ELECTROSURGICAL) ×2
ELECTRODE REM PT RTRN 9FT ADLT (ELECTROSURGICAL) ×1 IMPLANT
EVACUATOR SILICONE 100CC (DRAIN) IMPLANT
GLOVE BIO SURGEON STRL SZ7.5 (GLOVE) ×4 IMPLANT
GLOVE BIOGEL PI IND STRL 7.0 (GLOVE) ×1 IMPLANT
GLOVE BIOGEL PI INDICATOR 7.0 (GLOVE) ×1
GOWN STRL REUS W/ TWL XL LVL3 (GOWN DISPOSABLE) ×1 IMPLANT
GOWN STRL REUS W/TWL LRG LVL3 (GOWN DISPOSABLE) ×2 IMPLANT
GOWN STRL REUS W/TWL XL LVL3 (GOWN DISPOSABLE) ×3 IMPLANT
KIT BASIN OR (CUSTOM PROCEDURE TRAY) ×2 IMPLANT
MARKER SKIN DUAL TIP RULER LAB (MISCELLANEOUS) IMPLANT
NEEDLE HYPO 25X1 1.5 SAFETY (NEEDLE) ×2 IMPLANT
NS IRRIG 1000ML POUR BTL (IV SOLUTION) ×2 IMPLANT
PACK BASIC VI WITH GOWN DISP (CUSTOM PROCEDURE TRAY) ×2 IMPLANT
PENCIL BUTTON HOLSTER BLD 10FT (ELECTRODE) ×2 IMPLANT
SOL PREP POV-IOD 16OZ 10% (MISCELLANEOUS) ×2 IMPLANT
SPONGE GAUZE 4X4 12PLY (GAUZE/BANDAGES/DRESSINGS) ×2 IMPLANT
SPONGE LAP 18X18 X RAY DECT (DISPOSABLE) ×2 IMPLANT
SPONGE LAP 4X18 X RAY DECT (DISPOSABLE) IMPLANT
STAPLER VISISTAT 35W (STAPLE) IMPLANT
SUT MNCRL AB 4-0 PS2 18 (SUTURE) ×2 IMPLANT
SUT VIC AB 0 UR5 27 (SUTURE) ×2 IMPLANT
SUT VIC AB 3-0 SH 18 (SUTURE) IMPLANT
SUT VIC AB 3-0 SH 27 (SUTURE) ×1
SUT VIC AB 3-0 SH 27X BRD (SUTURE) ×1 IMPLANT
SYR CONTROL 10ML LL (SYRINGE) ×2 IMPLANT
TOWEL OR 17X26 10 PK STRL BLUE (TOWEL DISPOSABLE) ×2 IMPLANT
WATER STERILE IRR 1500ML POUR (IV SOLUTION) ×2 IMPLANT
YANKAUER SUCT BULB TIP 10FT TU (MISCELLANEOUS) IMPLANT

## 2014-01-20 NOTE — Transfer of Care (Signed)
Immediate Anesthesia Transfer of Care Note  Patient: Alexis Fleming  Procedure(s) Performed: Procedure(s) (LRB): EXCISION RIGHT LOWER ABDOMINAL WALL MASS (Right)  Patient Location: PACU  Anesthesia Type: General  Level of Consciousness: sedated, patient cooperative and responds to stimulation  Airway & Oxygen Therapy: Patient Spontanous Breathing and Patient connected to face mask oxgen  Post-op Assessment: Report given to PACU RN and Post -op Vital signs reviewed and stable  Post vital signs: Reviewed and stable  Complications: No apparent anesthesia complications

## 2014-01-20 NOTE — H&P (View-Only) (Signed)
Patient ID: Alexis Fleming, female   DOB: 06/29/1971, 42 y.o.   MRN: [id number]  Chief Complaint  Patient presents with  . New Evaluation    wound    HPI Alexis Fleming is a 42 y.o. female.  We're asked to see the patient in consultation by Dr. Scott Goldston to evaluate her for an abdominal wall mass. The patient is a 42-year-old female who first felt a mass in her right lower quadrant about 2 months ago. The pain has gotten worse since she first noticed it. She feels as though the mass is gotten a little bit larger since she first noticed it. She denies any nausea or vomiting. She denies any fevers or chills. Her appetite is good no bowels are working normally. She had a CT scan that shows a 1.6 cm mass in the subcutaneous tissue just on top of the abdominal wall muscles in the right lower quadrant.  HPI  Past Medical History  Diagnosis Date  . Thyroid goiter     bipsy done here 2013    Past Surgical History  Procedure Laterality Date  . Cesarean section  2003/2012    Family History  Problem Relation Age of Onset  . Cancer Mother     colon ca  . Heart disease Father     Social History History  Substance Use Topics  . Smoking status: Never Smoker   . Smokeless tobacco: Not on file  . Alcohol Use: No    Allergies  Allergen Reactions  . Shellfish Allergy Anaphylaxis and Swelling  . Betadine [Povidone Iodine] Rash    Leaves rash on body    Current Outpatient Prescriptions  Medication Sig Dispense Refill  . ibuprofen (ADVIL,MOTRIN) 200 MG tablet Take 200 mg by mouth every 6 (six) hours as needed.       No current facility-administered medications for this visit.    Review of Systems Review of Systems  Constitutional: Negative.   HENT: Negative.   Eyes: Negative.   Respiratory: Negative.   Cardiovascular: Negative.   Gastrointestinal: Negative.   Endocrine: Negative.   Genitourinary: Negative.   Musculoskeletal: Negative.   Skin: Negative.    Allergic/Immunologic: Negative.   Neurological: Negative.   Hematological: Negative.   Psychiatric/Behavioral: Negative.     Blood pressure 122/68, pulse 72, temperature 98 F (36.7 C), resp. rate 18, height 5' 4" (1.626 m), weight 150 lb (68.04 kg), last menstrual period 11/18/2013.  Physical Exam Physical Exam  Constitutional: She is oriented to person, place, and time. She appears well-developed and well-nourished.  HENT:  Head: Normocephalic and atraumatic.  Eyes: Conjunctivae and EOM are normal. Pupils are equal, round, and reactive to light.  Neck: Normal range of motion. Neck supple.  Cardiovascular: Normal rate, regular rhythm and normal heart sounds.   Pulmonary/Chest: Effort normal and breath sounds normal.  Abdominal: Soft. Bowel sounds are normal.  There is a palpable mass that feels as though it is about 1-1/2 cm in the right lower quadrant just superior to the edge of her C-section incision  Musculoskeletal: Normal range of motion.  Lymphadenopathy:    She has no cervical adenopathy.  Neurological: She is alert and oriented to person, place, and time.  Skin: Skin is warm and dry.  Psychiatric: She has a normal mood and affect. Her behavior is normal.    Data Reviewed As above  Assessment    The patient appears to have an abdominal wall mass and a right lower quadrant that is painful   and enlarging. Because of this I think it would be reasonable to remove this. I discussed with her in detail the risks and benefits of the operation to remove this mass as well as some of the technical aspects and she understands and wishes to proceed     Plan    Plan for excision of the right lower quadrant abdominal wall mass        TOTH III,PAUL S 12/28/2013, 2:25 PM

## 2014-01-20 NOTE — Op Note (Signed)
01/20/2014  8:07 AM  PATIENT:  Alexis Fleming  43 y.o. female  PRE-OPERATIVE DIAGNOSIS:  right abdominal wall mass  POST-OPERATIVE DIAGNOSIS:  right abdominal wall mass  PROCEDURE:  Procedure(s): EXCISION RIGHT LOWER ABDOMINAL WALL MASS (Right)  SURGEON:  Surgeon(s) and Role:    * Merrie Roof, MD - Primary  PHYSICIAN ASSISTANT:   ASSISTANTS: none   ANESTHESIA:   general  EBL:  Total I/O In: 1000 [I.V.:1000] Out: -   BLOOD ADMINISTERED:none  DRAINS: none   LOCAL MEDICATIONS USED:  MARCAINE     SPECIMEN:  Source of Specimen:  right abdominal wall mass  DISPOSITION OF SPECIMEN:  PATHOLOGY  COUNTS:  YES  TOURNIQUET:  * No tourniquets in log *  DICTATION: .Dragon Dictation After informed consent was obtained patient was brought to the operating room and placed in the supine position on the operating room table. After adequate induction of general anesthesia the patient's abdomen was prepped with ChloraPrep, allowed to dry, and draped in usual sterile manner. There is a palpable mass approximately 2 cm in the right lower abdominal wall near the lateral edge of her old C-section incision. A small transversely oriented incision was made with a 10 blade knife overlying the palpable mass. This incision was carried through the skin and subcutaneous tissue sharply with electrocautery. Blunt hemostat dissection was then carried out in the subcutaneous tissue until the mass was identified. The mass was excised sharply with electrocautery away from the rest of the subcutaneous tissue. The mass was sitting on the anterior fascia of the abdominal wall. A small portion of fascia was taken with the mass. Once the mass was removed it was sent to pathology for further evaluation. The small fascial defect was closed with interrupted 0 Vicryl stitches. The wound was infiltrated with quarter percent Marcaine and irrigated with saline. The deep layer the wound was then closed with interrupted  3-0 Vicryl stitches. The skin was then closed with interrupted 4-0 Monocryl subcuticular stitches. Dermabond dressings were applied. The patient tolerated the procedure well. At the end of the case the middle sponge and instrument counts were correct. The patient was then awakened and taken to recovery in stable condition.  PLAN OF CARE: Discharge to home after PACU  PATIENT DISPOSITION:  PACU - hemodynamically stable.   Delay start of Pharmacological VTE agent (>24hrs) due to surgical blood loss or risk of bleeding: not applicable

## 2014-01-20 NOTE — Anesthesia Postprocedure Evaluation (Signed)
Anesthesia Post Note  Patient: Alexis Fleming  Procedure(s) Performed: Procedure(s) (LRB): EXCISION RIGHT LOWER ABDOMINAL WALL MASS (Right)  Anesthesia type: General  Patient location: PACU  Post pain: Pain level controlled  Post assessment: Post-op Vital signs reviewed  Last Vitals:  Filed Vitals:   01/20/14 0925  BP: 127/69  Pulse: 68  Temp: 36.4 C  Resp: 16    Post vital signs: Reviewed  Level of consciousness: sedated  Complications: No apparent anesthesia complications

## 2014-01-20 NOTE — Interval H&P Note (Signed)
History and Physical Interval Note:  01/20/2014 7:16 AM  Alexis Fleming  has presented today for surgery, with the diagnosis of right abdominal wall mass  The various methods of treatment have been discussed with the patient and family. After consideration of risks, benefits and other options for treatment, the patient has consented to  Procedure(s): EXCISION RIGHT LOWER ABDOMINAL WALL MASS (N/A) as a surgical intervention .  The patient's history has been reviewed, patient examined, no change in status, stable for surgery.  I have reviewed the patient's chart and labs.  Questions were answered to the patient's satisfaction.     TOTH III,Nasier Thumm S

## 2014-01-20 NOTE — Preoperative (Signed)
Beta Blockers   Reason not to administer Beta Blockers:Not Applicable 

## 2014-01-20 NOTE — Addendum Note (Signed)
Addendum created 01/20/14 1055 by Montel Clock, CRNA   Modules edited: Anesthesia Medication Administration

## 2014-01-27 ENCOUNTER — Encounter (INDEPENDENT_AMBULATORY_CARE_PROVIDER_SITE_OTHER): Payer: Self-pay | Admitting: *Deleted

## 2014-01-31 ENCOUNTER — Telehealth (INDEPENDENT_AMBULATORY_CARE_PROVIDER_SITE_OTHER): Payer: Self-pay

## 2014-01-31 NOTE — Telephone Encounter (Signed)
LMOM> path results below.

## 2014-01-31 NOTE — Telephone Encounter (Signed)
Message copied by Carlene Coria on Tue Jan 31, 2014  2:53 PM ------      Message from: Luella Cook III      Created: Tue Jan 31, 2014  2:58 AM       Benign endometriosis ------

## 2014-02-09 NOTE — Telephone Encounter (Signed)
Pt returned call from 01-31-14. Pt given attached benign path result per request.

## 2014-02-20 ENCOUNTER — Encounter (INDEPENDENT_AMBULATORY_CARE_PROVIDER_SITE_OTHER): Payer: Self-pay | Admitting: General Surgery

## 2014-02-20 ENCOUNTER — Ambulatory Visit (INDEPENDENT_AMBULATORY_CARE_PROVIDER_SITE_OTHER): Payer: BC Managed Care – PPO | Admitting: General Surgery

## 2014-02-20 VITALS — BP 116/80 | HR 80 | Temp 97.0°F | Resp 14 | Ht 64.0 in | Wt 152.6 lb

## 2014-02-20 DIAGNOSIS — R1903 Right lower quadrant abdominal swelling, mass and lump: Secondary | ICD-10-CM

## 2014-02-20 NOTE — Progress Notes (Signed)
Subjective:     Patient ID: Alexis Fleming, female   DOB: 02-06-1971, 43 y.o.   MRN: [id number]  HPI The patient is a 43 year old female who is about 3 weeks status post excision of a mass from her right lower abdominal wall. The pathology on this came back as endometriosis. Since surgery the pain she was having in her right lower quadrant has disappeared. She feels great.  Review of Systems     Objective:   Physical Exam On exam her right lower quadrant incision is healing nicely with no sign of infection or significant seroma    Assessment:     The patient is 3 weeks status post excision of endometriosis from her right lower abdominal wall     Plan:     At this point she may return to her normal activities without restrictions. I will plan to see her back on a when necessary basis.

## 2014-02-20 NOTE — Patient Instructions (Signed)
May return to normal activities 

## 2015-01-16 ENCOUNTER — Other Ambulatory Visit: Payer: Self-pay

## 2015-01-16 DIAGNOSIS — Z1231 Encounter for screening mammogram for malignant neoplasm of breast: Secondary | ICD-10-CM

## 2015-01-17 ENCOUNTER — Ambulatory Visit
Admission: RE | Admit: 2015-01-17 | Discharge: 2015-01-17 | Disposition: A | Discharge status: Home / Self Care | Payer: BLUE CROSS/BLUE SHIELD | Source: Ambulatory Visit

## 2015-01-17 DIAGNOSIS — Z1231 Encounter for screening mammogram for malignant neoplasm of breast: Secondary | ICD-10-CM

## 2015-01-18 ENCOUNTER — Other Ambulatory Visit: Payer: Self-pay | Admitting: Family Medicine

## 2015-01-18 DIAGNOSIS — R928 Other abnormal and inconclusive findings on diagnostic imaging of breast: Secondary | ICD-10-CM

## 2015-01-31 ENCOUNTER — Other Ambulatory Visit: Payer: BLUE CROSS/BLUE SHIELD

## 2015-02-13 ENCOUNTER — Ambulatory Visit
Admission: RE | Admit: 2015-02-13 | Discharge: 2015-02-13 | Disposition: A | Discharge status: Home / Self Care | Payer: BLUE CROSS/BLUE SHIELD | Source: Ambulatory Visit | Attending: Family Medicine | Admitting: Family Medicine

## 2015-02-13 DIAGNOSIS — R928 Other abnormal and inconclusive findings on diagnostic imaging of breast: Secondary | ICD-10-CM

## 2015-02-14 ENCOUNTER — Other Ambulatory Visit: Payer: BLUE CROSS/BLUE SHIELD

## 2015-02-28 ENCOUNTER — Other Ambulatory Visit: Payer: Self-pay | Admitting: Family Medicine

## 2015-02-28 ENCOUNTER — Other Ambulatory Visit (HOSPITAL_COMMUNITY)
Admission: RE | Admit: 2015-02-28 | Discharge: 2015-02-28 | Disposition: A | Discharge status: Home / Self Care | Payer: BLUE CROSS/BLUE SHIELD | Source: Ambulatory Visit | Attending: Family Medicine | Admitting: Family Medicine

## 2015-02-28 DIAGNOSIS — Z1151 Encounter for screening for human papillomavirus (HPV): Secondary | ICD-10-CM | POA: Diagnosis not present

## 2015-02-28 DIAGNOSIS — Z124 Encounter for screening for malignant neoplasm of cervix: Secondary | ICD-10-CM | POA: Diagnosis not present

## 2015-03-06 LAB — CYTOLOGY - PAP

## 2016-01-04 ENCOUNTER — Other Ambulatory Visit: Payer: Self-pay | Admitting: Family Medicine

## 2016-01-04 ENCOUNTER — Other Ambulatory Visit: Payer: Self-pay

## 2016-01-04 DIAGNOSIS — Z1231 Encounter for screening mammogram for malignant neoplasm of breast: Secondary | ICD-10-CM

## 2016-01-29 ENCOUNTER — Ambulatory Visit
Admission: RE | Admit: 2016-01-29 | Discharge: 2016-01-29 | Disposition: A | Discharge status: Home / Self Care | Payer: BLUE CROSS/BLUE SHIELD | Source: Ambulatory Visit

## 2016-01-29 DIAGNOSIS — Z1231 Encounter for screening mammogram for malignant neoplasm of breast: Secondary | ICD-10-CM

## 2016-04-08 DIAGNOSIS — J029 Acute pharyngitis, unspecified: Secondary | ICD-10-CM | POA: Diagnosis not present

## 2016-10-16 DIAGNOSIS — J9801 Acute bronchospasm: Secondary | ICD-10-CM | POA: Diagnosis not present

## 2016-10-16 DIAGNOSIS — R05 Cough: Secondary | ICD-10-CM | POA: Diagnosis not present

## 2016-10-16 DIAGNOSIS — J029 Acute pharyngitis, unspecified: Secondary | ICD-10-CM | POA: Diagnosis not present

## 2016-11-06 DIAGNOSIS — H6980 Other specified disorders of Eustachian tube, unspecified ear: Secondary | ICD-10-CM | POA: Diagnosis not present

## 2016-11-06 DIAGNOSIS — R05 Cough: Secondary | ICD-10-CM | POA: Diagnosis not present

## 2016-12-20 DIAGNOSIS — J101 Influenza due to other identified influenza virus with other respiratory manifestations: Secondary | ICD-10-CM | POA: Diagnosis not present

## 2016-12-23 DIAGNOSIS — J209 Acute bronchitis, unspecified: Secondary | ICD-10-CM | POA: Diagnosis not present

## 2016-12-23 DIAGNOSIS — R05 Cough: Secondary | ICD-10-CM | POA: Diagnosis not present

## 2016-12-23 DIAGNOSIS — B349 Viral infection, unspecified: Secondary | ICD-10-CM | POA: Diagnosis not present

## 2016-12-25 DIAGNOSIS — J45901 Unspecified asthma with (acute) exacerbation: Secondary | ICD-10-CM | POA: Diagnosis not present

## 2016-12-30 ENCOUNTER — Other Ambulatory Visit: Payer: Self-pay | Admitting: Family Medicine

## 2016-12-30 DIAGNOSIS — Z1231 Encounter for screening mammogram for malignant neoplasm of breast: Secondary | ICD-10-CM

## 2017-01-02 DIAGNOSIS — J209 Acute bronchitis, unspecified: Secondary | ICD-10-CM | POA: Diagnosis not present

## 2017-01-02 DIAGNOSIS — Z8709 Personal history of other diseases of the respiratory system: Secondary | ICD-10-CM | POA: Diagnosis not present

## 2017-01-02 DIAGNOSIS — J4541 Moderate persistent asthma with (acute) exacerbation: Secondary | ICD-10-CM | POA: Diagnosis not present

## 2017-01-15 DIAGNOSIS — J209 Acute bronchitis, unspecified: Secondary | ICD-10-CM | POA: Diagnosis not present

## 2017-02-05 ENCOUNTER — Ambulatory Visit
Admission: RE | Admit: 2017-02-05 | Discharge: 2017-02-05 | Disposition: A | Discharge status: Home / Self Care | Payer: BLUE CROSS/BLUE SHIELD | Source: Ambulatory Visit | Attending: Family Medicine | Admitting: Family Medicine

## 2017-02-05 DIAGNOSIS — Z1231 Encounter for screening mammogram for malignant neoplasm of breast: Secondary | ICD-10-CM | POA: Diagnosis not present

## 2017-02-06 ENCOUNTER — Other Ambulatory Visit: Payer: Self-pay | Admitting: Family Medicine

## 2017-02-06 DIAGNOSIS — R928 Other abnormal and inconclusive findings on diagnostic imaging of breast: Secondary | ICD-10-CM

## 2017-02-11 DIAGNOSIS — E049 Nontoxic goiter, unspecified: Secondary | ICD-10-CM | POA: Diagnosis not present

## 2017-02-11 DIAGNOSIS — Z13220 Encounter for screening for lipoid disorders: Secondary | ICD-10-CM | POA: Diagnosis not present

## 2017-02-11 DIAGNOSIS — E559 Vitamin D deficiency, unspecified: Secondary | ICD-10-CM | POA: Diagnosis not present

## 2017-02-11 DIAGNOSIS — Z0000 Encounter for general adult medical examination without abnormal findings: Secondary | ICD-10-CM | POA: Diagnosis not present

## 2017-02-25 ENCOUNTER — Other Ambulatory Visit: Payer: BLUE CROSS/BLUE SHIELD

## 2017-03-11 ENCOUNTER — Ambulatory Visit
Admission: RE | Admit: 2017-03-11 | Discharge: 2017-03-11 | Disposition: A | Discharge status: Home / Self Care | Payer: BLUE CROSS/BLUE SHIELD | Source: Ambulatory Visit | Attending: Family Medicine | Admitting: Family Medicine

## 2017-03-11 DIAGNOSIS — R928 Other abnormal and inconclusive findings on diagnostic imaging of breast: Secondary | ICD-10-CM

## 2017-03-11 DIAGNOSIS — N6489 Other specified disorders of breast: Secondary | ICD-10-CM | POA: Diagnosis not present

## 2017-05-14 ENCOUNTER — Other Ambulatory Visit: Payer: Self-pay | Admitting: Ophthalmology

## 2017-05-14 DIAGNOSIS — H5310 Unspecified subjective visual disturbances: Secondary | ICD-10-CM | POA: Diagnosis not present

## 2017-05-14 DIAGNOSIS — H524 Presbyopia: Secondary | ICD-10-CM | POA: Diagnosis not present

## 2017-05-14 DIAGNOSIS — G453 Amaurosis fugax: Secondary | ICD-10-CM

## 2017-05-29 DIAGNOSIS — H5310 Unspecified subjective visual disturbances: Secondary | ICD-10-CM | POA: Diagnosis not present

## 2017-06-12 ENCOUNTER — Other Ambulatory Visit: Payer: BLUE CROSS/BLUE SHIELD

## 2017-06-30 ENCOUNTER — Ambulatory Visit
Admission: RE | Admit: 2017-06-30 | Discharge: 2017-06-30 | Disposition: A | Discharge status: Home / Self Care | Payer: BLUE CROSS/BLUE SHIELD | Source: Ambulatory Visit | Attending: Ophthalmology | Admitting: Ophthalmology

## 2017-06-30 DIAGNOSIS — G453 Amaurosis fugax: Secondary | ICD-10-CM

## 2017-06-30 MED ORDER — GADOBENATE DIMEGLUMINE 529 MG/ML IV SOLN
14.0000 mL | Freq: Once | INTRAVENOUS | Status: AC | PRN
  Administered 2017-06-30: 14 mL via INTRAVENOUS

## 2017-09-04 ENCOUNTER — Other Ambulatory Visit: Payer: Self-pay | Admitting: Family Medicine

## 2017-09-04 DIAGNOSIS — N6489 Other specified disorders of breast: Secondary | ICD-10-CM

## 2017-09-21 ENCOUNTER — Ambulatory Visit
Admission: RE | Admit: 2017-09-21 | Discharge: 2017-09-21 | Disposition: A | Discharge status: Home / Self Care | Payer: BLUE CROSS/BLUE SHIELD | Source: Ambulatory Visit | Attending: Family Medicine | Admitting: Family Medicine

## 2017-09-21 ENCOUNTER — Other Ambulatory Visit: Payer: Self-pay | Admitting: Family Medicine

## 2017-09-21 ENCOUNTER — Ambulatory Visit: Payer: BLUE CROSS/BLUE SHIELD

## 2017-09-21 DIAGNOSIS — R239 Unspecified skin changes: Secondary | ICD-10-CM

## 2017-09-21 DIAGNOSIS — N6489 Other specified disorders of breast: Secondary | ICD-10-CM | POA: Diagnosis not present

## 2017-09-21 DIAGNOSIS — R928 Other abnormal and inconclusive findings on diagnostic imaging of breast: Secondary | ICD-10-CM | POA: Diagnosis not present

## 2017-10-28 DIAGNOSIS — J029 Acute pharyngitis, unspecified: Secondary | ICD-10-CM | POA: Diagnosis not present

## 2017-10-30 DIAGNOSIS — J020 Streptococcal pharyngitis: Secondary | ICD-10-CM | POA: Diagnosis not present

## 2017-11-27 DIAGNOSIS — J029 Acute pharyngitis, unspecified: Secondary | ICD-10-CM | POA: Diagnosis not present

## 2017-11-27 DIAGNOSIS — B9789 Other viral agents as the cause of diseases classified elsewhere: Secondary | ICD-10-CM | POA: Diagnosis not present

## 2017-11-27 DIAGNOSIS — J4521 Mild intermittent asthma with (acute) exacerbation: Secondary | ICD-10-CM | POA: Diagnosis not present

## 2017-11-27 DIAGNOSIS — J069 Acute upper respiratory infection, unspecified: Secondary | ICD-10-CM | POA: Diagnosis not present

## 2018-04-14 ENCOUNTER — Other Ambulatory Visit (HOSPITAL_COMMUNITY)
Admission: RE | Admit: 2018-04-14 | Discharge: 2018-04-14 | Disposition: A | Discharge status: Home / Self Care | Payer: BLUE CROSS/BLUE SHIELD | Source: Ambulatory Visit | Attending: Family Medicine | Admitting: Family Medicine

## 2018-04-14 ENCOUNTER — Other Ambulatory Visit: Payer: Self-pay | Admitting: Family Medicine

## 2018-04-14 DIAGNOSIS — D259 Leiomyoma of uterus, unspecified: Secondary | ICD-10-CM | POA: Diagnosis not present

## 2018-04-14 DIAGNOSIS — Z01411 Encounter for gynecological examination (general) (routine) with abnormal findings: Secondary | ICD-10-CM | POA: Diagnosis not present

## 2018-04-14 DIAGNOSIS — E559 Vitamin D deficiency, unspecified: Secondary | ICD-10-CM | POA: Diagnosis not present

## 2018-04-14 DIAGNOSIS — Z124 Encounter for screening for malignant neoplasm of cervix: Secondary | ICD-10-CM | POA: Diagnosis not present

## 2018-04-14 DIAGNOSIS — Z0000 Encounter for general adult medical examination without abnormal findings: Secondary | ICD-10-CM | POA: Diagnosis not present

## 2018-04-14 DIAGNOSIS — E049 Nontoxic goiter, unspecified: Secondary | ICD-10-CM | POA: Diagnosis not present

## 2018-04-16 LAB — CYTOLOGY - PAP: Diagnosis: NEGATIVE

## 2018-09-13 DIAGNOSIS — J45901 Unspecified asthma with (acute) exacerbation: Secondary | ICD-10-CM | POA: Diagnosis not present

## 2018-09-21 ENCOUNTER — Other Ambulatory Visit: Payer: Self-pay | Admitting: Family Medicine

## 2018-09-21 DIAGNOSIS — N6489 Other specified disorders of breast: Secondary | ICD-10-CM

## 2018-10-01 ENCOUNTER — Ambulatory Visit
Admission: RE | Admit: 2018-10-01 | Discharge: 2018-10-01 | Disposition: A | Discharge status: Home / Self Care | Payer: BLUE CROSS/BLUE SHIELD | Source: Ambulatory Visit | Attending: Family Medicine | Admitting: Family Medicine

## 2018-10-01 DIAGNOSIS — R922 Inconclusive mammogram: Secondary | ICD-10-CM | POA: Diagnosis not present

## 2018-10-01 DIAGNOSIS — N6489 Other specified disorders of breast: Secondary | ICD-10-CM

## 2018-12-13 ENCOUNTER — Other Ambulatory Visit: Payer: Self-pay | Admitting: Family Medicine

## 2018-12-13 DIAGNOSIS — R1903 Right lower quadrant abdominal swelling, mass and lump: Secondary | ICD-10-CM

## 2018-12-13 DIAGNOSIS — E049 Nontoxic goiter, unspecified: Secondary | ICD-10-CM | POA: Diagnosis not present

## 2018-12-13 DIAGNOSIS — N921 Excessive and frequent menstruation with irregular cycle: Secondary | ICD-10-CM | POA: Diagnosis not present

## 2018-12-13 DIAGNOSIS — R1900 Intra-abdominal and pelvic swelling, mass and lump, unspecified site: Secondary | ICD-10-CM | POA: Diagnosis not present

## 2018-12-14 DIAGNOSIS — R1909 Other intra-abdominal and pelvic swelling, mass and lump: Secondary | ICD-10-CM | POA: Diagnosis not present

## 2018-12-15 DIAGNOSIS — N920 Excessive and frequent menstruation with regular cycle: Secondary | ICD-10-CM | POA: Diagnosis not present

## 2018-12-15 DIAGNOSIS — D500 Iron deficiency anemia secondary to blood loss (chronic): Secondary | ICD-10-CM | POA: Diagnosis not present

## 2018-12-15 DIAGNOSIS — D252 Subserosal leiomyoma of uterus: Secondary | ICD-10-CM | POA: Diagnosis not present

## 2018-12-17 ENCOUNTER — Other Ambulatory Visit: Payer: BLUE CROSS/BLUE SHIELD

## 2019-01-14 DIAGNOSIS — Z800 Family history of malignant neoplasm of digestive organs: Secondary | ICD-10-CM | POA: Diagnosis not present

## 2019-01-14 DIAGNOSIS — D259 Leiomyoma of uterus, unspecified: Secondary | ICD-10-CM | POA: Diagnosis not present

## 2019-01-14 DIAGNOSIS — J020 Streptococcal pharyngitis: Secondary | ICD-10-CM | POA: Diagnosis not present

## 2019-01-14 DIAGNOSIS — J45901 Unspecified asthma with (acute) exacerbation: Secondary | ICD-10-CM | POA: Diagnosis not present

## 2019-04-05 DIAGNOSIS — D252 Subserosal leiomyoma of uterus: Secondary | ICD-10-CM | POA: Diagnosis not present

## 2019-04-05 DIAGNOSIS — R1901 Right upper quadrant abdominal swelling, mass and lump: Secondary | ICD-10-CM | POA: Diagnosis not present

## 2019-04-06 ENCOUNTER — Telehealth: Payer: Self-pay | Admitting: *Deleted

## 2019-04-06 ENCOUNTER — Other Ambulatory Visit: Payer: Self-pay

## 2019-04-06 NOTE — Telephone Encounter (Signed)
Called and spoke with the patient, scheduled an appt for Friday

## 2019-04-08 ENCOUNTER — Encounter: Payer: Self-pay | Admitting: Gynecologic Oncology

## 2019-04-08 ENCOUNTER — Inpatient Hospital Stay: Payer: BLUE CROSS/BLUE SHIELD | Attending: Gynecologic Oncology | Admitting: Gynecologic Oncology

## 2019-04-08 ENCOUNTER — Other Ambulatory Visit: Payer: Self-pay

## 2019-04-08 ENCOUNTER — Telehealth: Payer: Self-pay | Admitting: *Deleted

## 2019-04-08 VITALS — BP 123/74 | HR 78 | Temp 98.4°F | Resp 20 | Ht 64.0 in | Wt 150.0 lb

## 2019-04-08 DIAGNOSIS — D251 Intramural leiomyoma of uterus: Secondary | ICD-10-CM | POA: Insufficient documentation

## 2019-04-08 DIAGNOSIS — R1900 Intra-abdominal and pelvic swelling, mass and lump, unspecified site: Secondary | ICD-10-CM | POA: Insufficient documentation

## 2019-04-08 DIAGNOSIS — R1901 Right upper quadrant abdominal swelling, mass and lump: Secondary | ICD-10-CM

## 2019-04-08 DIAGNOSIS — D252 Subserosal leiomyoma of uterus: Secondary | ICD-10-CM | POA: Insufficient documentation

## 2019-04-08 DIAGNOSIS — D259 Leiomyoma of uterus, unspecified: Secondary | ICD-10-CM

## 2019-04-08 NOTE — Progress Notes (Signed)
Consult Note: Gyn-Onc  Consult was requested by Dr. Nelda Marseille for the evaluation of Alexis Fleming 48 y.o. female  CC:  Chief Complaint  Patient presents with  . Pelvic mass  . abdominal mass    Assessment/Plan:  Alexis. Alexis Fleming  is a 48 y.o.  year old with a right upper quadrant mass and fibroid uterus. This mass is likely a subserosal fibroid. It has increased 4-5cm in size over a 5 month period (abeit with scans at 2 different locations).  We will obtain an MRI abd/pelvis to better characterize the mass and ensure it is a subserosal fibroid and not a retroperitoneal sarcoma or soft tissue tumor arising from an alternative structure.  If it is a subserosal fibroid I am recommending robotic assisted total hysterectomy >250gm with bilateral salpingectomy and minilaparotomy for specimen removal (in tact). I discussed surgical risks including  bleeding, infection, damage to internal organs (such as bladder,ureters, bowels), blood clot, reoperation and rehospitalization. I discussed that due to COVID-19 she will need to be off work and quarantined for 2 weeks preop.   I discussed that continued observation may be reasonable, however it is already symptomatic and will likely not shrink much, including with menopause. Therefore I am recommending surgery now when a MIS approach may be feasible.  I discussed that hysterectomy will result in permanent infertility and the patient is agreeable with this.  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   The patient has been known to have a fibroid uterus for many years.  This was relatively asymptomatic with the exception of gradually progressive menorrhagia over the years.  She noticed an upper abdominal mass in January 2020 and was seen and evaluated with an ultrasound scan performed at [facility] on December 14, 2018.  This revealed a uterus  measuring 12.1 x 4.3 x 6.2 cm and endometrium measuring 9 mm, and a mass in the right abdomen measuring 13.4 x 9.3 cm which is likely a pedunculated fibroid.  The right ovary measured 3.8 cm and was normal the left ovary measured 2.7 cm and was also normal.  She was seen and evaluated in May again by Dr. Nelda Marseille who repeated a pelvic and abdominal ultrasound scan which showed a uterus measuring 12.8 cm in length by 5.2 x 5.6 cm with a 5 mm endometrium.  There was a large hypoechoic solid mass seen in the right mid abdomen to the right of the umbilicus which measured 17.3 x 15.1 x 11 cm and had increased from the previous ultrasound.  Was possibly a pedunculated fibroid.  The left ovary contained a complex cyst with low-level internal echoes measuring 2.5 cm this cyst was avascular.  The right ovary was not visualized.  The patient reports that she had noted progressive increase in size in the mass which was somewhat uncomfortable with sitting and laying flat.  The patient's surgical history is otherwise significant for 2 prior cesarean sections the first of which was by her vertical midline incision due to emergency.  She subsequently developed endometriosis in her C-section incision which was operated in 2015 with Dr. Marlou Starks.  These have been her only prior abdominal surgeries.  The patient is otherwise fairly healthy though she does have asthma for which she has historically been treated in the wintertime with prednisone.  However she does not like the way prednisone makes  her feel and last took this in the winter 2019.  She has had 2 prior childbirths prior cesarean section.  She works as a Geneticist, molecular through the current health system.  Her mother has a history of metastatic colon cancer diagnosed in the [place].  She has a maternal first cousin with history of breast cancer.  She is a non-smoker and nondiabetic.  Her BMI was 25 kg/m.  Current Meds:  Outpatient Encounter Medications as of 04/08/2019   Medication Sig  . albuterol (PROAIR HFA) 108 (90 Base) MCG/ACT inhaler   . albuterol (PROVENTIL) (2.5 MG/3ML) 0.083% nebulizer solution Inhale into the lungs.  Marland Kitchen EPINEPHrine 0.3 mg/0.3 mL IJ SOAJ injection as directed   No facility-administered encounter medications on file as of 04/08/2019.     Allergy:  Allergies  Allergen Reactions  . Other Anaphylaxis    Patient stated that she can not eat Shrimp. Other shellfish is fine  . Betadine [Povidone Iodine] Rash    Leaves rash on body    Social Hx:   Social History   Socioeconomic History  . Marital status: Married    Spouse name: Not on file  . Number of children: Not on file  . Years of education: Not on file  . Highest education level: Not on file  Occupational History  . Not on file  Social Needs  . Financial resource strain: Not on file  . Food insecurity:    Worry: Not on file    Inability: Not on file  . Transportation needs:    Medical: Not on file    Non-medical: Not on file  Tobacco Use  . Smoking status: Never Smoker  . Smokeless tobacco: Never Used  Substance and Sexual Activity  . Alcohol use: No  . Drug use: No  . Sexual activity: Yes  Lifestyle  . Physical activity:    Days per week: Not on file    Minutes per session: Not on file  . Stress: Not on file  Relationships  . Social connections:    Talks on phone: Not on file    Gets together: Not on file    Attends religious service: Not on file    Active member of club or organization: Not on file    Attends meetings of clubs or organizations: Not on file    Relationship status: Not on file  . Intimate partner violence:    Fear of current or ex partner: Not on file    Emotionally abused: Not on file    Physically abused: Not on file    Forced sexual activity: Not on file  Other Topics Concern  . Not on file  Social History Narrative  . Not on file    Past Surgical Hx:  Past Surgical History:  Procedure Laterality Date  . CESAREAN SECTION   2003/2012  . IRRIGATION AND DEBRIDEMENT ABSCESS Right 01/20/2014   Procedure: EXCISION RIGHT LOWER ABDOMINAL WALL MASS;  Surgeon: Merrie Roof, MD;  Location: [facility];  Service: General;  Laterality: Right;    Past Medical Hx:  Past Medical History:  Diagnosis Date  . Eczema   . Endometriosis   . Hypertension    last 2 weeks of pregnancy - 3 years ago   . Seasonal allergies   . Thyroid goiter    bipsy done here 2013    Past Gynecological History:  No hx of abnormal paps, endometriosis in cesarean section incision. Cesarean sections x 2 No LMP recorded.  Family Hx:  Family History  Problem Relation Age of Onset  . Colon cancer Mother   . Heart disease Father   . Breast cancer Cousin     Review of Systems:  Constitutional  Feels well,    ENT Normal appearing ears and nares bilaterally Skin/Breast  No rash, sores, jaundice, itching, dryness Cardiovascular  No chest pain, shortness of breath, or edema  Pulmonary  No cough or wheeze.  Gastro Intestinal  No nausea, vomitting, or diarrhoea. No bright red blood per rectum, no abdominal pain, change in bowel movement, or constipation. + abdominal distension Genito Urinary  No frequency, urgency, dysuria, + menorrhagia Musculo Skeletal  No myalgia, arthralgia, joint swelling or pain  Neurologic  No weakness, numbness, change in gait,  Psychology  No depression, anxiety, insomnia.   Vitals:  Blood pressure 123/74, pulse 78, temperature 98.4 F (36.9 C), temperature source Oral, resp. rate 20, height 5\' 4"  (1.626 m), weight 150 lb (68 kg), SpO2 100 %.  Physical Exam: WD in NAD Neck  Supple NROM, without any enlargements.  Lymph Node Survey No cervical supraclavicular or inguinal adenopathy Cardiovascular  Pulse normal rate, regularity and rhythm. S1 and S2 normal.  Lungs  Clear to auscultation bilateraly, without wheezes/crackles/rhonchi. Good air movement.  Skin  No rash/lesions/breakdown  Psychiatry  Alert and  oriented to person, place, and time  Abdomen  Normoactive bowel sounds, abdomen soft, non-tender and nonobese without evidence of hernia. There is a palpable, mobile, spherical firm mass in the right upper quadrant/mid abdomen Back No CVA tenderness Genito Urinary  Vulva/vagina: Normal external female genitalia.  No lesions. No discharge or bleeding.  Bladder/urethra:  No lesions or masses, well supported bladder  Vagina: normal  Cervix: Normal appearing, no lesions.  Uterus:  narrow mobile, no parametrial involvement or nodularity. Unclear relationship between uterus and upper abdominal mass  Adnexa: no discretely palpable masses on bimanual exam Rectal  deferred Extremities  No bilateral cyanosis, clubbing or edema.   Alexis Solo, MD  04/08/2019, 10:23 AM

## 2019-04-08 NOTE — Telephone Encounter (Signed)
Called and spoke with the patient regarding her MRI appt. Gave the patient the appt date/time and address/directions

## 2019-04-08 NOTE — Patient Instructions (Signed)
Please call our office with your schedule to arrange for your MRI and when you have decided on a surgical date.  Recommendation is to have an MRI and we will contact you with the results.  You will need to be off from work 2 weeks pre-operatively.   Preparing for your Surgery  Plan for surgery with Dr. Everitt Amber at [facility] will be scheduled for a robotic assisted total laparoscopic hysterectomy greater than 250 grams, bilateral salpingectomy, mini laparotomy.   Pre-operative Testing -You will receive a phone call from presurgical testing at [facility] if you have not received a call already to arrange for a pre-operative testing appointment before your surgery.  This appointment normally occurs one to two weeks before your scheduled surgery.   -Bring your insurance card, copy of an advanced directive if applicable, medication list  -At that visit, you will be asked to sign a consent for a possible blood transfusion in case a transfusion becomes necessary during surgery.  The need for a blood transfusion is rare but having consent is a necessary part of your care.     -You should not be taking blood thinners or aspirin at least ten days prior to surgery unless instructed by your surgeon.  -Do not take supplements such as fish oil (omega 3), red yeast rice, tumeric before your surgery.  Day Before Surgery at [facility] will be asked to take in a light diet the day before surgery.  Avoid carbonated beverages.  You will be advised to have nothing to eat or drink after midnight the evening before.    Eat a light diet the day before surgery.  Examples including soups, broths, toast, yogurt, mashed potatoes.  Things to avoid include carbonated beverages (fizzy beverages), raw fruits and raw vegetables, or beans.   If your bowels are filled with gas, your surgeon will have difficulty visualizing your pelvic organs which increases your surgical risks.  Your role in  recovery Your role is to become active as soon as directed by your doctor, while still giving yourself time to heal.  Rest when you feel tired. You will be asked to do the following in order to speed your recovery:  - Cough and breathe deeply. This helps toclear and expand your lungs and can prevent pneumonia. You may be given a spirometer to practice deep breathing. A staff member will show you how to use the spirometer. - Do mild physical activity. Walking or moving your legs help your circulation and body functions return to normal. A staff member will help you when you try to walk and will provide you with simple exercises. Do not try to get up or walk alone the first time. - Actively manage your pain. Managing your pain lets you move in comfort. We will ask you to rate your pain on a scale of zero to 10. It is your responsibility to tell your doctor or nurse where and how much you hurt so your pain can be treated.  Special Considerations -If you are diabetic, you may be placed on insulin after surgery to have closer control over your blood sugars to promote healing and recovery.  This does not mean that you will be discharged on insulin.  If applicable, your oral antidiabetics will be resumed when you are tolerating a solid diet.  -Your final pathology results from surgery should be available around one week after surgery and the results will be relayed to you when available.  -  Dr. Lahoma Crocker is the surgeon that assists your GYN Oncologist with surgery.  If you end up staying the night, the next day after your surgery you will either see Dr. Denman George or Dr. Lahoma Crocker.  -FMLA forms can be faxed to [phone] and please allow 5-7 business days for completion.  Pain Management After Surgery -You have been prescribed your pain medication and bowel regimen medications before surgery so that you can have these available when you are discharged from the hospital. The pain medication is  for use ONLY AFTER surgery and a new prescription will not be given.   -Make sure that you have Tylenol and Ibuprofen at home to use on a regular basis after surgery for pain control. We recommend alternating the medications every hour to six hours since they work differently and are processed in the body differently for pain relief.  -Review the attached handout on narcotic use and their risks and side effects.   Bowel Regimen -You have been prescribed Sennakot-S to take nightly to prevent constipation especially if you are taking the narcotic pain medication intermittently.  It is important to prevent constipation and drink adequate amounts of liquids.  Blood Transfusion Information WHAT IS A BLOOD TRANSFUSION? A transfusion is the replacement of blood or some of its parts. Blood is made up of multiple cells which provide different functions.  Red blood cells carry oxygen and are used for blood loss replacement.  White blood cells fight against infection.  Platelets control bleeding.  Plasma helps clot blood.  Other blood products are available for specialized needs, such as hemophilia or other clotting disorders. BEFORE THE TRANSFUSION  Who gives blood for transfusions?   You may be able to donate blood to be used at a later date on yourself (autologous donation).  Relatives can be asked to donate blood. This is generally not any safer than if you have received blood from a stranger. The same precautions are taken to ensure safety when a relative's blood is donated.  Healthy volunteers who are fully evaluated to make sure their blood is safe. This is blood bank blood. Transfusion therapy is the safest it has ever been in the practice of medicine. Before blood is taken from a donor, a complete history is taken to make sure that person has no history of diseases nor engages in risky social behavior (examples are intravenous drug use or sexual activity with multiple partners). The  donor's travel history is screened to minimize risk of transmitting infections, such as malaria. The donated blood is tested for signs of infectious diseases, such as HIV and hepatitis. The blood is then tested to be sure it is compatible with you in order to minimize the chance of a transfusion reaction. If you or a relative donates blood, this is often done in anticipation of surgery and is not appropriate for emergency situations. It takes many days to process the donated blood. RISKS AND COMPLICATIONS Although transfusion therapy is very safe and saves many lives, the main dangers of transfusion include:   Getting an infectious disease.  Developing a transfusion reaction. This is an allergic reaction to something in the blood you were given. Every precaution is taken to prevent this. The decision to have a blood transfusion has been considered carefully by your caregiver before blood is given. Blood is not given unless the benefits outweigh the risks.  AFTER SURGERY RECOMMENDATIONS  Return to work: 4-6 weeks if applicable  Activity: 1. Be up and out of  the bed during the day.  Take a nap if needed.  You may walk up steps but be careful and use the hand rail.  Stair climbing will tire you more than you think, you may need to stop part way and rest.   2. No lifting or straining for 6 weeks.  3. No driving for 1 week(s).  Do not drive if you are taking narcotic pain medicine.  4. Shower daily.  Use soap and water on your incision and pat dry; don't rub.  No tub baths until cleared by your surgeon.   5. No sexual activity and nothing in the vagina for 8 weeks.  6. You may experience a small amount of clear drainage from your incisions, which is normal.  If the drainage persists or increases, please call the office.  7. You may experience vaginal spotting after surgery or around the 6-8 week mark from surgery when the stitches at the top of the vagina begin to dissolve.  The spotting is  normal but if you experience heavy bleeding, call our office.  8. Take Tylenol or ibuprofen first for pain and only use narcotic pain medication for severe pain not relieved by the Tylenol or Ibuprofen.  Monitor your Tylenol intake to a max of 4,000 mg a day.  Diet: 1. Low sodium Heart Healthy Diet is recommended.  2. It is safe to use a laxative, such as Miralax or Colace, if you have difficulty moving your bowels. You can take Sennakot at bedtime every evening to keep bowel movements regular and to prevent constipation.    Wound Care: 1. Keep clean and dry.  Shower daily.  Reasons to call the Doctor:  Fever - Oral temperature greater than 100.4 degrees Fahrenheit  Foul-smelling vaginal discharge  Difficulty urinating  Nausea and vomiting  Increased pain at the site of the incision that is unrelieved with pain medicine.  Difficulty breathing with or without chest pain  New calf pain especially if only on one side  Sudden, continuing increased vaginal bleeding with or without clots.

## 2019-04-14 ENCOUNTER — Ambulatory Visit (HOSPITAL_COMMUNITY)
Admission: RE | Admit: 2019-04-14 | Discharge: 2019-04-14 | Disposition: A | Discharge status: Home / Self Care | Payer: BLUE CROSS/BLUE SHIELD | Source: Ambulatory Visit | Attending: Gynecologic Oncology | Admitting: Gynecologic Oncology

## 2019-04-14 ENCOUNTER — Other Ambulatory Visit: Payer: Self-pay

## 2019-04-14 DIAGNOSIS — N281 Cyst of kidney, acquired: Secondary | ICD-10-CM | POA: Diagnosis not present

## 2019-04-14 DIAGNOSIS — D259 Leiomyoma of uterus, unspecified: Secondary | ICD-10-CM | POA: Diagnosis not present

## 2019-04-14 DIAGNOSIS — N1330 Unspecified hydronephrosis: Secondary | ICD-10-CM | POA: Diagnosis not present

## 2019-04-14 DIAGNOSIS — D252 Subserosal leiomyoma of uterus: Secondary | ICD-10-CM

## 2019-04-14 DIAGNOSIS — D251 Intramural leiomyoma of uterus: Secondary | ICD-10-CM

## 2019-04-14 DIAGNOSIS — R1901 Right upper quadrant abdominal swelling, mass and lump: Secondary | ICD-10-CM | POA: Insufficient documentation

## 2019-04-14 DIAGNOSIS — Q638 Other specified congenital malformations of kidney: Secondary | ICD-10-CM | POA: Diagnosis not present

## 2019-04-14 MED ORDER — GADOBUTROL 1 MMOL/ML IV SOLN
7.5000 mL | Freq: Once | INTRAVENOUS | Status: AC | PRN
  Administered 2019-04-14: 7 mL via INTRAVENOUS

## 2019-04-20 ENCOUNTER — Encounter: Payer: Self-pay | Admitting: Gynecologic Oncology

## 2019-04-20 ENCOUNTER — Telehealth: Payer: Self-pay | Admitting: Gynecologic Oncology

## 2019-04-20 NOTE — Telephone Encounter (Signed)
Left message for the patient at her home number and her cell asking her to please call the office to discuss MRI results.

## 2019-04-21 ENCOUNTER — Telehealth: Payer: Self-pay | Admitting: Gynecologic Oncology

## 2019-04-21 NOTE — Telephone Encounter (Signed)
Patient informed of MRI results.  Discussed Dr. Serita Grit recommendations to still continue with surgery. Answered all questions in detail. Patient has a family reunion at the end of June and would like to attend and then have her surgery around the middle to end of July. Advised her that is there is malignancy, we do not advised waiting too long for surgery. Informed her that I would discuss the potential of July 14 for surgery with Dr. Denman George and call her back tomorrow to discuss.  All questions and concerns answered. No concerns voiced at end of call.

## 2019-04-25 ENCOUNTER — Other Ambulatory Visit: Payer: Self-pay | Admitting: Gynecologic Oncology

## 2019-04-25 ENCOUNTER — Telehealth: Payer: Self-pay | Admitting: Gynecologic Oncology

## 2019-04-25 DIAGNOSIS — R1901 Right upper quadrant abdominal swelling, mass and lump: Secondary | ICD-10-CM

## 2019-04-25 NOTE — Telephone Encounter (Signed)
Called patient and informed her that Dr. Denman George was fine with July 14 as a surgery date.  Advised the patient that Dr. Denman George would be going out of town a few days after the procedure but she would have her partners covering if there were any issues or concerns.  Patient is comfortable with proceeding.  Advised her that we would get her set up for a pre-surgical appt in the office with myself and at the hospital. Advised to call for any questions or concerns.

## 2019-05-23 ENCOUNTER — Telehealth: Payer: Self-pay | Admitting: *Deleted

## 2019-05-23 NOTE — Telephone Encounter (Signed)
Called and gave the patient her pre op appt with [facility]

## 2019-05-25 NOTE — Patient Instructions (Addendum)
YOU NEED TO HAVE A COVID 19 TEST ON_______TODAY,  July10th_______, THIS TEST MUST BE DONE BEFORE SURGERY, COME TO [place] ENTRANCE. ONCE YOUR COVID TEST IS COMPLETED, PLEASE BEGIN THE QUARANTINE INSTRUCTIONS AS OUTLINED IN YOUR HANDOUT.                Alexis Fleming    Your procedure is scheduled on: 05-31-2019  Report to [facility] Main  Entrance     Report to [place] at 530 AM      Call this number if you have problems the morning of surgery [phone]    Remember: Eat a light diet the day before surgery.  Examples including soups, broths, toast, yogurt, mashed potatoes.  Things to avoid include carbonated beverages (fizzy beverages), raw fruits and raw vegetables, or beans. If your bowels are filled with gas, your surgeon will have difficulty visualizing your pelvic organs which increases your surgical risks.   Do not eat food  :After Midnight. YOU MAY HAVE CLEAR LIQUIDS FROM MIDNIGHT UNTIL 4:30AM. NOTHING BY MOUTH AFTER 4:30AM!   BRUSH YOUR TEETH MORNING OF SURGERY AND RINSE YOUR MOUTH OUT, NO CHEWING GUM CANDY OR MINTS.       CLEAR LIQUID DIET   Foods Allowed                                                                     Foods Excluded  Coffee and tea, regular and decaf                             liquids that you cannot  Plain Jell-O in any flavor                                             see through such as: Fruit ices (not with fruit pulp)                                     milk, soups, orange juice  Iced Popsicles                                    All solid food Cranberry, grape and apple juices Sports drinks like Gatorade Lightly seasoned clear broth or consume(fat free) Sugar, honey syrup  Sample Menu Breakfast                                Lunch                                     Supper Cranberry juice                    Beef broth  Chicken broth Jell-O                                      Grape juice                           Apple juice Coffee or tea                        Jell-O                                      Popsicle                                                Coffee or tea                        Coffee or tea  _____________________________________________________________________    Take these medicines the morning of surgery with A SIP OF WATER: ALBUTEROL INHALER IF NEEDED (PLEASE BRING)               You may not have any metal on your body including hair pins and              piercings  Do not wear jewelry, make-up, lotions, powders or perfumes, deodorant             Do not wear nail polish.  Do not shave  48 hours prior to surgery.                 Do not bring valuables to the hospital. [place].  Contacts, dentures or bridgework may not be worn into surgery.      Patients discharged the day of surgery will not be allowed to drive home. IF YOU ARE HAVING SURGERY AND GOING HOME THE SAME DAY, YOU MUST HAVE AN ADULT TO DRIVE YOU HOME AND BE WITH YOU FOR 24 HOURS. YOU MAY GO HOME BY TAXI OR UBER OR ORTHERWISE, BUT AN ADULT MUST ACCOMPANY YOU HOME AND STAY WITH YOU FOR 24 HOURS.  Name and phone number of your driver:  Special Instructions: N/A              Please read over the following fact sheets you were given: _____________________________________________________________________             [facility] - Preparing for Surgery Before surgery, you can play an important role.  Because skin is not sterile, your skin needs to be as free of germs as possible.  You can reduce the number of germs on your skin by washing with CHG (chlorahexidine gluconate) soap before surgery.  CHG is an antiseptic cleaner which kills germs and bonds with the skin to continue killing germs even after washing. Please DO NOT use if you have an allergy to CHG or antibacterial soaps.  If your skin becomes reddened/irritated  stop using the CHG and inform your nurse when you arrive at Short Stay. Do not shave (including legs and underarms) for  at least 48 hours prior to the first CHG shower.  You may shave your face/neck. Please follow these instructions carefully:  1.  Shower with CHG Soap the night before surgery and the  morning of Surgery.  2.  If you choose to wash your hair, wash your hair first as usual with your  normal  shampoo.  3.  After you shampoo, rinse your hair and body thoroughly to remove the  shampoo.                           4.  Use CHG as you would any other liquid soap.  You can apply chg directly  to the skin and wash                       Gently with a scrungie or clean washcloth.  5.  Apply the CHG Soap to your body ONLY FROM THE NECK DOWN.   Do not use on face/ open                           Wound or open sores. Avoid contact with eyes, ears mouth and genitals (private parts).                       Wash face,  Genitals (private parts) with your normal soap.             6.  Wash thoroughly, paying special attention to the area where your surgery  will be performed.  7.  Thoroughly rinse your body with warm water from the neck down.  8.  DO NOT shower/wash with your normal soap after using and rinsing off  the CHG Soap.                9.  Pat yourself dry with a clean towel.            10.  Wear clean pajamas.            11.  Place clean sheets on your bed the night of your first shower and do not  sleep with pets. Day of Surgery : Do not apply any lotions/deodorants the morning of surgery.  Please wear clean clothes to the hospital/surgery center.  FAILURE TO FOLLOW THESE INSTRUCTIONS MAY RESULT IN THE CANCELLATION OF YOUR SURGERY PATIENT SIGNATURE_________________________________  NURSE SIGNATURE__________________________________  ________________________________________________________________________   Alexis Fleming  An incentive spirometer is a tool that can help keep your  lungs clear and active. This tool measures how well you are filling your lungs with each breath. Taking long deep breaths may help reverse or decrease the chance of developing breathing (pulmonary) problems (especially infection) following:  A long period of time when you are unable to move or be active. BEFORE THE PROCEDURE   If the spirometer includes an indicator to show your best effort, your nurse or respiratory therapist will set it to a desired goal.  If possible, sit up straight or lean slightly forward. Try not to slouch.  Hold the incentive spirometer in an upright position. INSTRUCTIONS FOR USE  1. Sit on the edge of your bed if possible, or sit up as far as you can in bed or on a chair. 2. Hold the incentive spirometer in an upright position. 3. Breathe out normally. 4. Place the mouthpiece in your mouth and seal your lips tightly around  it. 5. Breathe in slowly and as deeply as possible, raising the piston or the ball toward the top of the column. 6. Hold your breath for 3-5 seconds or for as long as possible. Allow the piston or ball to fall to the bottom of the column. 7. Remove the mouthpiece from your mouth and breathe out normally. 8. Rest for a few seconds and repeat Steps 1 through 7 at least 10 times every 1-2 hours when you are awake. Take your time and take a few normal breaths between deep breaths. 9. The spirometer may include an indicator to show your best effort. Use the indicator as a goal to work toward during each repetition. 10. After each set of 10 deep breaths, practice coughing to be sure your lungs are clear. If you have an incision (the cut made at the time of surgery), support your incision when coughing by placing a pillow or rolled up towels firmly against it. Once you are able to get out of bed, walk around indoors and cough well. You may stop using the incentive spirometer when instructed by your caregiver.  RISKS AND COMPLICATIONS  Take your time so  you do not get dizzy or light-headed.  If you are in pain, you may need to take or ask for pain medication before doing incentive spirometry. It is harder to take a deep breath if you are having pain. AFTER USE  Rest and breathe slowly and easily.  It can be helpful to keep track of a log of your progress. Your caregiver can provide you with a simple table to help with this. If you are using the spirometer at home, follow these instructions: [place] IF:   You are having difficultly using the spirometer.  You have trouble using the spirometer as often as instructed.  Your pain medication is not giving enough relief while using the spirometer.  You develop fever of 100.5 F (38.1 C) or higher. SEEK IMMEDIATE MEDICAL CARE IF:   You cough up bloody sputum that had not been present before.  You develop fever of 102 F (38.9 C) or greater.  You develop worsening pain at or near the incision site. MAKE SURE YOU:   Understand these instructions.  Will watch your condition.  Will get help right away if you are not doing well or get worse. Document Released: 03/16/2007 Document Revised: 01/26/2012 Document Reviewed: 05/17/2007 Exit[employer] Patient Information 2014 [employer], [place].   ________________________________________________________________________  WHAT IS A BLOOD TRANSFUSION? Blood Transfusion Information  A transfusion is the replacement of blood or some of its parts. Blood is made up of multiple cells which provide different functions.  Red blood cells carry oxygen and are used for blood loss replacement.  White blood cells fight against infection.  Platelets control bleeding.  Plasma helps clot blood.  Other blood products are available for specialized needs, such as hemophilia or other clotting disorders. BEFORE THE TRANSFUSION  Who gives blood for transfusions?   Healthy volunteers who are fully evaluated to make sure their blood is safe. This is blood  bank blood. Transfusion therapy is the safest it has ever been in the practice of medicine. Before blood is taken from a donor, a complete history is taken to make sure that person has no history of diseases nor engages in risky social behavior (examples are intravenous drug use or sexual activity with multiple partners). The donor's travel history is screened to minimize risk of transmitting infections, such as malaria. The donated  blood is tested for signs of infectious diseases, such as HIV and hepatitis. The blood is then tested to be sure it is compatible with you in order to minimize the chance of a transfusion reaction. If you or a relative donates blood, this is often done in anticipation of surgery and is not appropriate for emergency situations. It takes many days to process the donated blood. RISKS AND COMPLICATIONS Although transfusion therapy is very safe and saves many lives, the main dangers of transfusion include:   Getting an infectious disease.  Developing a transfusion reaction. This is an allergic reaction to something in the blood you were given. Every precaution is taken to prevent this. The decision to have a blood transfusion has been considered carefully by your caregiver before blood is given. Blood is not given unless the benefits outweigh the risks. AFTER THE TRANSFUSION  Right after receiving a blood transfusion, you will usually feel much better and more energetic. This is especially true if your red blood cells have gotten low (anemic). The transfusion raises the level of the red blood cells which carry oxygen, and this usually causes an energy increase.  The nurse administering the transfusion will monitor you carefully for complications. HOME CARE INSTRUCTIONS  No special instructions are needed after a transfusion. You may find your energy is better. Speak with your caregiver about any limitations on activity for underlying diseases you may have. SEEK MEDICAL CARE  IF:   Your condition is not improving after your transfusion.  You develop redness or irritation at the intravenous (IV) site. SEEK IMMEDIATE MEDICAL CARE IF:  Any of the following symptoms occur over the next 12 hours:  Shaking chills.  You have a temperature by mouth above 102 F (38.9 C), not controlled by medicine.  Chest, back, or muscle pain.  People around you feel you are not acting correctly or are confused.  Shortness of breath or difficulty breathing.  Dizziness and fainting.  You get a rash or develop hives.  You have a decrease in urine output.  Your urine turns a dark color or changes to pink, red, or brown. Any of the following symptoms occur over the next 10 days:  You have a temperature by mouth above 102 F (38.9 C), not controlled by medicine.  Shortness of breath.  Weakness after normal activity.  The white part of the eye turns yellow (jaundice).  You have a decrease in the amount of urine or are urinating less often.  Your urine turns a dark color or changes to pink, red, or brown. Document Released: 10/31/2000 Document Revised: 01/26/2012 Document Reviewed: 06/19/2008 [facility] Patient Information 2014 [place], [place].  _______________________________________________________________________

## 2019-05-27 ENCOUNTER — Inpatient Hospital Stay: Payer: BC Managed Care – PPO | Attending: Gynecologic Oncology | Admitting: Gynecologic Oncology

## 2019-05-27 ENCOUNTER — Other Ambulatory Visit (HOSPITAL_COMMUNITY)
Admission: RE | Admit: 2019-05-27 | Discharge: 2019-05-27 | Disposition: A | Discharge status: Home / Self Care | Payer: BC Managed Care – PPO | Source: Ambulatory Visit | Attending: Gynecologic Oncology | Admitting: Gynecologic Oncology

## 2019-05-27 ENCOUNTER — Encounter (HOSPITAL_COMMUNITY): Payer: Self-pay

## 2019-05-27 ENCOUNTER — Encounter (HOSPITAL_COMMUNITY)
Admission: RE | Admit: 2019-05-27 | Discharge: 2019-05-27 | Disposition: A | Discharge status: Home / Self Care | Payer: BC Managed Care – PPO | Source: Ambulatory Visit | Attending: Gynecologic Oncology | Admitting: Gynecologic Oncology

## 2019-05-27 ENCOUNTER — Other Ambulatory Visit: Payer: Self-pay

## 2019-05-27 VITALS — BP 129/75 | HR 88 | Temp 98.2°F | Resp 20 | Ht 64.0 in | Wt 147.8 lb

## 2019-05-27 DIAGNOSIS — R1900 Intra-abdominal and pelvic swelling, mass and lump, unspecified site: Secondary | ICD-10-CM | POA: Diagnosis not present

## 2019-05-27 DIAGNOSIS — D259 Leiomyoma of uterus, unspecified: Secondary | ICD-10-CM | POA: Diagnosis not present

## 2019-05-27 DIAGNOSIS — Z1159 Encounter for screening for other viral diseases: Secondary | ICD-10-CM | POA: Diagnosis not present

## 2019-05-27 DIAGNOSIS — Z01812 Encounter for preprocedural laboratory examination: Secondary | ICD-10-CM | POA: Diagnosis not present

## 2019-05-27 LAB — COMPREHENSIVE METABOLIC PANEL
ALT: 19 U/L (ref 0–44)
AST: 26 U/L (ref 15–41)
Albumin: 4 g/dL (ref 3.5–5.0)
Alkaline Phosphatase: 47 U/L (ref 38–126)
Anion gap: 7 (ref 5–15)
BUN: 10 mg/dL (ref 6–20)
CO2: 24 mmol/L (ref 22–32)
Calcium: 8.8 mg/dL — ABNORMAL LOW (ref 8.9–10.3)
Chloride: 105 mmol/L (ref 98–111)
Creatinine, Ser: 0.61 mg/dL (ref 0.44–1.00)
GFR calc Af Amer: >60 mL/min (ref >60)
GFR calc non Af Amer: >60 mL/min (ref >60)
Glucose, Bld: 96 mg/dL (ref 70–99)
Potassium: 3.6 mmol/L (ref 3.5–5.1)
Sodium: 136 mmol/L (ref 135–145)
Total Bilirubin: 0.7 mg/dL (ref 0.3–1.2)
Total Protein: 9.2 g/dL — ABNORMAL HIGH (ref 6.5–8.1)

## 2019-05-27 LAB — URINALYSIS, ROUTINE W REFLEX MICROSCOPIC
Bilirubin Urine: NEGATIVE
Glucose, UA: NEGATIVE mg/dL
Hgb urine dipstick: NEGATIVE
Ketones, ur: 5 mg/dL — AB
Leukocytes,Ua: NEGATIVE
Nitrite: NEGATIVE
Protein, ur: NEGATIVE mg/dL
Specific Gravity, Urine: 1.014 (ref 1.005–1.030)
pH: 5 (ref 5.0–8.0)

## 2019-05-27 LAB — CBC
HCT: 36.4 % (ref 36.0–46.0)
Hemoglobin: 11.1 g/dL — ABNORMAL LOW (ref 12.0–15.0)
MCH: 25.3 pg — ABNORMAL LOW (ref 26.0–34.0)
MCHC: 30.5 g/dL (ref 30.0–36.0)
MCV: 82.9 fL (ref 80.0–100.0)
Platelets: 252 10*3/uL (ref 150–400)
RBC: 4.39 MIL/uL (ref 3.87–5.11)
RDW: 17.3 % — ABNORMAL HIGH (ref 11.5–15.5)
WBC: 7.3 10*3/uL (ref 4.0–10.5)
nRBC: 0 % (ref 0.0–0.2)

## 2019-05-27 MED ORDER — OXYCODONE HCL 5 MG PO TABS: 5.0000 mg | ORAL_TABLET | ORAL | 0 refills | Status: DC | PRN

## 2019-05-27 MED ORDER — SENNOSIDES-DOCUSATE SODIUM 8.6-50 MG PO TABS: 2.0000 | ORAL_TABLET | Freq: Every day | ORAL | 0 refills | Status: DC

## 2019-05-27 MED ORDER — IBUPROFEN 600 MG PO TABS: 600.0000 mg | ORAL_TABLET | Freq: Four times a day (QID) | ORAL | 0 refills | Status: DC | PRN

## 2019-05-27 NOTE — Patient Instructions (Signed)
Preparing for your Surgery  Plan for surgery on May 31, 2019 with Dr. Everitt Amber at [place] will be scheduled for a robotic assisted total hysterectomy greater than 250 grams, bilateral salpingectomy, mini laparotomy.   Pre-operative Testing -You will receive a phone call from presurgical testing at [facility] if you have not received a call already to arrange for a pre-operative testing appointment before your surgery.  This appointment normally occurs one to two weeks before your scheduled surgery.   -Bring your insurance card, copy of an advanced directive if applicable, medication list  -At that visit, you will be asked to sign a consent for a possible blood transfusion in case a transfusion becomes necessary during surgery.  The need for a blood transfusion is rare but having consent is a necessary part of your care.     -You should not be taking blood thinners or aspirin at least ten days prior to surgery unless instructed by your surgeon.  -Do not take supplements such as fish oil (omega 3), red yeast rice, tumeric before your surgery.  Day Before Surgery at [place] will be asked to take in a light diet the day before surgery.  Avoid carbonated beverages.  You will be advised to have nothing to eat or drink after midnight the evening before.    Eat a light diet the day before surgery.  Examples including soups, broths, toast, yogurt, mashed potatoes.  Things to avoid include carbonated beverages (fizzy beverages), raw fruits and raw vegetables, or beans.   If your bowels are filled with gas, your surgeon will have difficulty visualizing your pelvic organs which increases your surgical risks.  Your role in recovery Your role is to become active as soon as directed by your doctor, while still giving yourself time to heal.  Rest when you feel tired. You will be asked to do the following in order to speed your recovery:  - Cough and breathe deeply. This  helps toclear and expand your lungs and can prevent pneumonia. You may be given a spirometer to practice deep breathing. A staff member will show you how to use the spirometer. - Do mild physical activity. Walking or moving your legs help your circulation and body functions return to normal. A staff member will help you when you try to walk and will provide you with simple exercises. Do not try to get up or walk alone the first time. - Actively manage your pain. Managing your pain lets you move in comfort. We will ask you to rate your pain on a scale of zero to 10. It is your responsibility to tell your doctor or nurse where and how much you hurt so your pain can be treated.  Special Considerations -If you are diabetic, you may be placed on insulin after surgery to have closer control over your blood sugars to promote healing and recovery.  This does not mean that you will be discharged on insulin.  If applicable, your oral antidiabetics will be resumed when you are tolerating a solid diet.  -Your final pathology results from surgery should be available around one week after surgery and the results will be relayed to you when available.  -Dr. Lahoma Crocker is the surgeon that assists your GYN Oncologist with surgery.  If you end up staying the night, the next day after your surgery you will either see Dr. Denman George or Dr. Lahoma Crocker.  -FMLA forms can be faxed to [phone] and please  allow 5-7 business days for completion.  Pain Management After Surgery -You have been prescribed your pain medication and bowel regimen medications before surgery so that you can have these available when you are discharged from the hospital. The pain medication is for use ONLY AFTER surgery and a new prescription will not be given.   -Make sure that you have Tylenol and Ibuprofen at home to use on a regular basis after surgery for pain control. We recommend alternating the medications every hour to six hours  since they work differently and are processed in the body differently for pain relief.  -Review the attached handout on narcotic use and their risks and side effects.   Bowel Regimen -You have been prescribed Sennakot-S to take nightly to prevent constipation especially if you are taking the narcotic pain medication intermittently.  It is important to prevent constipation and drink adequate amounts of liquids.  Blood Transfusion Information WHAT IS A BLOOD TRANSFUSION? A transfusion is the replacement of blood or some of its parts. Blood is made up of multiple cells which provide different functions.  Red blood cells carry oxygen and are used for blood loss replacement.  White blood cells fight against infection.  Platelets control bleeding.  Plasma helps clot blood.  Other blood products are available for specialized needs, such as hemophilia or other clotting disorders. BEFORE THE TRANSFUSION  Who gives blood for transfusions?   You may be able to donate blood to be used at a later date on yourself (autologous donation).  Relatives can be asked to donate blood. This is generally not any safer than if you have received blood from a stranger. The same precautions are taken to ensure safety when a relative's blood is donated.  Healthy volunteers who are fully evaluated to make sure their blood is safe. This is blood bank blood. Transfusion therapy is the safest it has ever been in the practice of medicine. Before blood is taken from a donor, a complete history is taken to make sure that person has no history of diseases nor engages in risky social behavior (examples are intravenous drug use or sexual activity with multiple partners). The donor's travel history is screened to minimize risk of transmitting infections, such as malaria. The donated blood is tested for signs of infectious diseases, such as HIV and hepatitis. The blood is then tested to be sure it is compatible with you in order  to minimize the chance of a transfusion reaction. If you or a relative donates blood, this is often done in anticipation of surgery and is not appropriate for emergency situations. It takes many days to process the donated blood. RISKS AND COMPLICATIONS Although transfusion therapy is very safe and saves many lives, the main dangers of transfusion include:   Getting an infectious disease.  Developing a transfusion reaction. This is an allergic reaction to something in the blood you were given. Every precaution is taken to prevent this. The decision to have a blood transfusion has been considered carefully by your caregiver before blood is given. Blood is not given unless the benefits outweigh the risks.  AFTER SURGERY CONSIDERATIONS  05/27/2019  Return to work: 4-6 weeks if applicable  Activity: 1. Be up and out of the bed during the day.  Take a nap if needed.  You may walk up steps but be careful and use the hand rail.  Stair climbing will tire you more than you think, you may need to stop part way and rest.  2. No lifting or straining for 6 weeks.  3. No driving for 1 week(s).  Do not drive if you are taking narcotic pain medicine.  4. Shower daily.  Use soap and water on your incision and pat dry; don't rub.  No tub baths until cleared by your surgeon.   5. No sexual activity and nothing in the vagina for 8 weeks.  6. You may experience a small amount of clear drainage from your incisions, which is normal.  If the drainage persists or increases, please call the office.  7. You may experience vaginal spotting after surgery or around the 6-8 week mark from surgery when the stitches at the top of the vagina begin to dissolve.  The spotting is normal but if you experience heavy bleeding, call our office.  8. Take Tylenol or ibuprofen first for pain and only use NARCOTIC pain medications for severe pain not relieved by the Tylenol or Ibuprofen.  Monitor your Tylenol intake to a max of  4,000 mg a day.  Diet: 1. Low sodium Heart Healthy Diet is recommended.  2. It is safe to use a laxative, such as Miralax or Colace, if you have difficulty moving your bowels. You can take Sennakot at bedtime every evening to keep bowel movements regular and to prevent constipation.    Wound Care: 1. Keep clean and dry.  Shower daily.  Reasons to call the Doctor:  Fever - Oral temperature greater than 100.4 degrees Fahrenheit  Foul-smelling vaginal discharge  Difficulty urinating  Nausea and vomiting  Increased pain at the site of the incision that is unrelieved with pain medicine.  Difficulty breathing with or without chest pain  New calf pain especially if only on one side  Sudden, continuing increased vaginal bleeding with or without clots.   Contacts: For questions or concerns you should contact:  Dr. Everitt Amber at [phone]  Joylene John, NP at [phone]  After Hours: call [phone] and have the GYN Oncologist paged/contacted

## 2019-05-28 LAB — SARS CORONAVIRUS 2 (TAT 6-24 HRS): SARS Coronavirus 2: NEGATIVE

## 2019-05-29 LAB — ABO/RH: ABO/RH(D): O POS

## 2019-05-30 NOTE — Anesthesia Preprocedure Evaluation (Addendum)
Anesthesia Evaluation  Patient identified by MRN, date of birth, ID band Patient awake    Reviewed: Allergy & Precautions, NPO status , Patient's Chart, lab work & pertinent test results  Airway Mallampati: I  TM Distance: >3 FB Neck ROM: Full    Dental no notable dental hx.    Pulmonary neg pulmonary ROS,    Pulmonary exam normal        Cardiovascular hypertension, Normal cardiovascular exam     Neuro/Psych negative neurological ROS     GI/Hepatic negative GI ROS, Neg liver ROS,   Endo/Other  negative endocrine ROS  Renal/GU negative Renal ROS     Musculoskeletal negative musculoskeletal ROS (+)   Abdominal   Peds  Hematology  (+) anemia , Hgb 11.1   Anesthesia Other Findings Day of surgery medications reviewed with the patient.  Reproductive/Obstetrics Uterine fibroid                            Anesthesia Physical Anesthesia Plan  ASA: II  Anesthesia Plan: General   Post-op Pain Management:    Induction: Intravenous  PONV Risk Score and Plan: 4 or greater and Treatment may vary due to age or medical condition, Ondansetron, Dexamethasone, Midazolam and Scopolamine patch - Pre-op  Airway Management Planned: Oral ETT  Additional Equipment:   Intra-op Plan:   Post-operative Plan: Extubation in OR  Informed Consent: I have reviewed the patients History and Physical, chart, labs and discussed the procedure including the risks, benefits and alternatives for the proposed anesthesia with the patient or authorized representative who has indicated his/her understanding and acceptance.     Dental advisory given  Plan Discussed with: CRNA  Anesthesia Plan Comments:        Anesthesia Quick Evaluation

## 2019-05-31 ENCOUNTER — Ambulatory Visit (HOSPITAL_COMMUNITY)
Admission: RE | Admit: 2019-05-31 | Discharge: 2019-05-31 | Disposition: A | Discharge status: Home / Self Care | Payer: BC Managed Care – PPO | Attending: Gynecologic Oncology | Admitting: Gynecologic Oncology

## 2019-05-31 ENCOUNTER — Ambulatory Visit (HOSPITAL_COMMUNITY): Payer: BC Managed Care – PPO | Admitting: Anesthesiology

## 2019-05-31 ENCOUNTER — Encounter: Payer: Self-pay | Admitting: Gynecologic Oncology

## 2019-05-31 ENCOUNTER — Encounter (HOSPITAL_COMMUNITY): Payer: Self-pay | Admitting: *Deleted

## 2019-05-31 ENCOUNTER — Ambulatory Visit (HOSPITAL_COMMUNITY): Payer: BC Managed Care – PPO | Admitting: Physician Assistant

## 2019-05-31 ENCOUNTER — Encounter (HOSPITAL_COMMUNITY)
Admission: RE | Disposition: A | Discharge status: Home / Self Care | Payer: Self-pay | Source: Home / Self Care | Attending: Gynecologic Oncology

## 2019-05-31 DIAGNOSIS — D252 Subserosal leiomyoma of uterus: Secondary | ICD-10-CM | POA: Diagnosis not present

## 2019-05-31 DIAGNOSIS — D251 Intramural leiomyoma of uterus: Secondary | ICD-10-CM | POA: Diagnosis present

## 2019-05-31 DIAGNOSIS — D259 Leiomyoma of uterus, unspecified: Secondary | ICD-10-CM

## 2019-05-31 DIAGNOSIS — J45909 Unspecified asthma, uncomplicated: Secondary | ICD-10-CM | POA: Diagnosis not present

## 2019-05-31 DIAGNOSIS — Z91013 Allergy to seafood: Secondary | ICD-10-CM | POA: Insufficient documentation

## 2019-05-31 DIAGNOSIS — Z888 Allergy status to other drugs, medicaments and biological substances status: Secondary | ICD-10-CM | POA: Diagnosis not present

## 2019-05-31 DIAGNOSIS — Z803 Family history of malignant neoplasm of breast: Secondary | ICD-10-CM | POA: Diagnosis not present

## 2019-05-31 DIAGNOSIS — Z79899 Other long term (current) drug therapy: Secondary | ICD-10-CM | POA: Diagnosis not present

## 2019-05-31 DIAGNOSIS — Z800 Family history of malignant neoplasm of digestive organs: Secondary | ICD-10-CM | POA: Diagnosis not present

## 2019-05-31 DIAGNOSIS — N920 Excessive and frequent menstruation with regular cycle: Secondary | ICD-10-CM | POA: Diagnosis not present

## 2019-05-31 DIAGNOSIS — R1901 Right upper quadrant abdominal swelling, mass and lump: Secondary | ICD-10-CM | POA: Diagnosis present

## 2019-05-31 DIAGNOSIS — N803 Endometriosis of pelvic peritoneum: Secondary | ICD-10-CM | POA: Diagnosis not present

## 2019-05-31 DIAGNOSIS — N72 Inflammatory disease of cervix uteri: Secondary | ICD-10-CM | POA: Diagnosis not present

## 2019-05-31 DIAGNOSIS — I10 Essential (primary) hypertension: Secondary | ICD-10-CM | POA: Insufficient documentation

## 2019-05-31 DIAGNOSIS — D649 Anemia, unspecified: Secondary | ICD-10-CM | POA: Diagnosis not present

## 2019-05-31 DIAGNOSIS — N809 Endometriosis, unspecified: Secondary | ICD-10-CM | POA: Diagnosis not present

## 2019-05-31 HISTORY — PX: ROBOTIC ASSISTED TOTAL HYSTERECTOMY: SHX6085

## 2019-05-31 HISTORY — PX: XI ROBOTIC ASSISTED SALPINGECTOMY: SHX6824

## 2019-05-31 LAB — TYPE AND SCREEN
ABO/RH(D): O POS
Antibody Screen: NEGATIVE

## 2019-05-31 LAB — PREGNANCY, URINE: Preg Test, Ur: NEGATIVE

## 2019-05-31 SURGERY — HYSTERECTOMY, TOTAL, ROBOT-ASSISTED
Anesthesia: General

## 2019-05-31 MED ORDER — FENTANYL CITRATE (PF) 100 MCG/2ML IJ SOLN
INTRAMUSCULAR | Status: AC
  Filled 2019-05-31: qty 2

## 2019-05-31 MED ORDER — DEXAMETHASONE SODIUM PHOSPHATE 4 MG/ML IJ SOLN: 4.0000 mg | INTRAMUSCULAR | Status: DC

## 2019-05-31 MED ORDER — ACETAMINOPHEN 10 MG/ML IV SOLN
1000.0000 mg | Freq: Once | INTRAVENOUS | Status: DC | PRN
  Administered 2019-05-31: 1000 mg via INTRAVENOUS

## 2019-05-31 MED ORDER — BUPIVACAINE HCL (PF) 0.25 % IJ SOLN
INTRAMUSCULAR | Status: AC
  Filled 2019-05-31: qty 30

## 2019-05-31 MED ORDER — SUGAMMADEX SODIUM 200 MG/2ML IV SOLN
INTRAVENOUS | Status: DC | PRN
  Administered 2019-05-31: 200 mg via INTRAVENOUS

## 2019-05-31 MED ORDER — MIDAZOLAM HCL 2 MG/2ML IJ SOLN
INTRAMUSCULAR | Status: AC
  Filled 2019-05-31: qty 2

## 2019-05-31 MED ORDER — ONDANSETRON HCL 4 MG/2ML IJ SOLN
INTRAMUSCULAR | Status: DC | PRN
  Administered 2019-05-31: 4 mg via INTRAVENOUS

## 2019-05-31 MED ORDER — SODIUM CHLORIDE 0.9% FLUSH: 3.0000 mL | Freq: Two times a day (BID) | INTRAVENOUS | Status: DC

## 2019-05-31 MED ORDER — ROCURONIUM BROMIDE 10 MG/ML (PF) SYRINGE
PREFILLED_SYRINGE | INTRAVENOUS | Status: DC | PRN
  Administered 2019-05-31: 10 mg via INTRAVENOUS
  Administered 2019-05-31: 20 mg via INTRAVENOUS
  Administered 2019-05-31: 50 mg via INTRAVENOUS

## 2019-05-31 MED ORDER — DEXAMETHASONE SODIUM PHOSPHATE 10 MG/ML IJ SOLN
INTRAMUSCULAR | Status: DC | PRN
  Administered 2019-05-31: 5 mg via INTRAVENOUS

## 2019-05-31 MED ORDER — HYDROMORPHONE HCL 1 MG/ML IJ SOLN
INTRAMUSCULAR | Status: AC
  Filled 2019-05-31: qty 1

## 2019-05-31 MED ORDER — GABAPENTIN 300 MG PO CAPS
300.0000 mg | ORAL_CAPSULE | ORAL | Status: AC
  Administered 2019-05-31: 300 mg via ORAL
  Filled 2019-05-31: qty 1

## 2019-05-31 MED ORDER — FENTANYL CITRATE (PF) 250 MCG/5ML IJ SOLN
INTRAMUSCULAR | Status: DC | PRN
  Administered 2019-05-31: 100 ug via INTRAVENOUS

## 2019-05-31 MED ORDER — EPHEDRINE SULFATE-NACL 50-0.9 MG/10ML-% IV SOSY
PREFILLED_SYRINGE | INTRAVENOUS | Status: DC | PRN
  Administered 2019-05-31 (×3): 10 mg via INTRAVENOUS

## 2019-05-31 MED ORDER — LACTATED RINGERS IV SOLN
INTRAVENOUS | Status: DC
  Administered 2019-05-31 (×2): via INTRAVENOUS

## 2019-05-31 MED ORDER — BUPIVACAINE HCL 0.25 % IJ SOLN
INTRAMUSCULAR | Status: DC | PRN
  Administered 2019-05-31: 30 mL

## 2019-05-31 MED ORDER — LIDOCAINE 2% (20 MG/ML) 5 ML SYRINGE
INTRAMUSCULAR | Status: DC | PRN
  Administered 2019-05-31: 60 mg via INTRAVENOUS

## 2019-05-31 MED ORDER — PROMETHAZINE HCL 25 MG/ML IJ SOLN: 6.2500 mg | INTRAMUSCULAR | Status: DC | PRN

## 2019-05-31 MED ORDER — MORPHINE SULFATE (PF) 4 MG/ML IV SOLN: 2.0000 mg | INTRAVENOUS | Status: DC | PRN

## 2019-05-31 MED ORDER — LIDOCAINE 2% (20 MG/ML) 5 ML SYRINGE
INTRAMUSCULAR | Status: AC
  Filled 2019-05-31: qty 5

## 2019-05-31 MED ORDER — SCOPOLAMINE 1 MG/3DAYS TD PT72
1.0000 | MEDICATED_PATCH | TRANSDERMAL | Status: DC
  Administered 2019-05-31: 1.5 mg via TRANSDERMAL
  Filled 2019-05-31: qty 1

## 2019-05-31 MED ORDER — PHENYLEPHRINE 40 MCG/ML (10ML) SYRINGE FOR IV PUSH (FOR BLOOD PRESSURE SUPPORT)
PREFILLED_SYRINGE | INTRAVENOUS | Status: AC
  Filled 2019-05-31: qty 10

## 2019-05-31 MED ORDER — LACTATED RINGERS IR SOLN
Status: DC | PRN
  Administered 2019-05-31: 1

## 2019-05-31 MED ORDER — OXYCODONE HCL 5 MG PO TABS
5.0000 mg | ORAL_TABLET | Freq: Once | ORAL | Status: AC | PRN
  Administered 2019-05-31: 13:00:00 5 mg via ORAL

## 2019-05-31 MED ORDER — ONDANSETRON HCL 4 MG/2ML IJ SOLN
INTRAMUSCULAR | Status: AC
  Filled 2019-05-31: qty 2

## 2019-05-31 MED ORDER — OXYCODONE HCL 5 MG PO TABS: 5.0000 mg | ORAL_TABLET | ORAL | Status: DC | PRN

## 2019-05-31 MED ORDER — SODIUM CHLORIDE 0.9% FLUSH: 3.0000 mL | INTRAVENOUS | Status: DC | PRN

## 2019-05-31 MED ORDER — ACETAMINOPHEN 10 MG/ML IV SOLN
INTRAVENOUS | Status: AC
  Filled 2019-05-31: qty 100

## 2019-05-31 MED ORDER — STERILE WATER FOR IRRIGATION IR SOLN
Status: DC | PRN
  Administered 2019-05-31: 1000 mL

## 2019-05-31 MED ORDER — ACETAMINOPHEN 500 MG PO TABS
1000.0000 mg | ORAL_TABLET | ORAL | Status: AC
  Administered 2019-05-31: 1000 mg via ORAL
  Filled 2019-05-31: qty 2

## 2019-05-31 MED ORDER — KETOROLAC TROMETHAMINE 30 MG/ML IJ SOLN
30.0000 mg | Freq: Four times a day (QID) | INTRAMUSCULAR | Status: DC
  Administered 2019-05-31: 14:00:00 30 mg via INTRAVENOUS

## 2019-05-31 MED ORDER — ROCURONIUM BROMIDE 10 MG/ML (PF) SYRINGE
PREFILLED_SYRINGE | INTRAVENOUS | Status: AC
  Filled 2019-05-31: qty 10

## 2019-05-31 MED ORDER — OXYCODONE HCL 5 MG/5ML PO SOLN: 5.0000 mg | Freq: Once | ORAL | Status: AC | PRN

## 2019-05-31 MED ORDER — OXYCODONE HCL 5 MG PO TABS
ORAL_TABLET | ORAL | Status: AC
  Filled 2019-05-31: qty 1

## 2019-05-31 MED ORDER — EPHEDRINE 5 MG/ML INJ
INTRAVENOUS | Status: AC
  Filled 2019-05-31: qty 10

## 2019-05-31 MED ORDER — SODIUM CHLORIDE 0.9 % IV SOLN: 250.0000 mL | INTRAVENOUS | Status: DC | PRN

## 2019-05-31 MED ORDER — CEFAZOLIN SODIUM-DEXTROSE 2-4 GM/100ML-% IV SOLN
2.0000 g | INTRAVENOUS | Status: AC
  Administered 2019-05-31: 2 g via INTRAVENOUS
  Filled 2019-05-31: qty 100

## 2019-05-31 MED ORDER — ENOXAPARIN SODIUM 40 MG/0.4ML ~~LOC~~ SOLN
40.0000 mg | SUBCUTANEOUS | Status: AC
  Administered 2019-05-31: 40 mg via SUBCUTANEOUS
  Filled 2019-05-31: qty 0.4

## 2019-05-31 MED ORDER — PHENYLEPHRINE 40 MCG/ML (10ML) SYRINGE FOR IV PUSH (FOR BLOOD PRESSURE SUPPORT)
PREFILLED_SYRINGE | INTRAVENOUS | Status: DC | PRN
  Administered 2019-05-31 (×4): 80 ug via INTRAVENOUS

## 2019-05-31 MED ORDER — HYDROMORPHONE HCL 1 MG/ML IJ SOLN
0.2500 mg | INTRAMUSCULAR | Status: DC | PRN
  Administered 2019-05-31 (×3): 0.5 mg via INTRAVENOUS

## 2019-05-31 MED ORDER — KETOROLAC TROMETHAMINE 30 MG/ML IJ SOLN
INTRAMUSCULAR | Status: AC
  Filled 2019-05-31: qty 1

## 2019-05-31 MED ORDER — SUCCINYLCHOLINE CHLORIDE 200 MG/10ML IV SOSY
PREFILLED_SYRINGE | INTRAVENOUS | Status: AC
  Filled 2019-05-31: qty 10

## 2019-05-31 MED ORDER — CELECOXIB 200 MG PO CAPS
400.0000 mg | ORAL_CAPSULE | ORAL | Status: AC
  Administered 2019-05-31: 06:00:00 400 mg via ORAL
  Filled 2019-05-31: qty 2

## 2019-05-31 MED ORDER — BUPIVACAINE LIPOSOME 1.3 % IJ SUSP
20.0000 mL | Freq: Once | INTRAMUSCULAR | Status: AC
  Administered 2019-05-31: 20 mL
  Filled 2019-05-31: qty 20

## 2019-05-31 MED ORDER — MIDAZOLAM HCL 5 MG/5ML IJ SOLN
INTRAMUSCULAR | Status: DC | PRN
  Administered 2019-05-31: 2 mg via INTRAVENOUS

## 2019-05-31 MED ORDER — PROPOFOL 10 MG/ML IV BOLUS
INTRAVENOUS | Status: AC
  Filled 2019-05-31: qty 20

## 2019-05-31 MED ORDER — ACETAMINOPHEN 650 MG RE SUPP
650.0000 mg | RECTAL | Status: DC | PRN
  Filled 2019-05-31: qty 1

## 2019-05-31 MED ORDER — ACETAMINOPHEN 325 MG PO TABS: 650.0000 mg | ORAL_TABLET | ORAL | Status: DC | PRN

## 2019-05-31 MED ORDER — PROPOFOL 10 MG/ML IV BOLUS
INTRAVENOUS | Status: DC | PRN
  Administered 2019-05-31: 120 mg via INTRAVENOUS
  Administered 2019-05-31: 40 mg via INTRAVENOUS

## 2019-05-31 SURGICAL SUPPLY — 64 items
APPLICATOR SURGIFLO ENDO (HEMOSTASIS) IMPLANT
BAG LAPAROSCOPIC 12 15 PORT 16 (BASKET) IMPLANT
BAG RETRIEVAL 12/15 (BASKET)
CELLS DAT CNTRL 66122 CELL SVR (MISCELLANEOUS) ×2 IMPLANT
COVER BACK TABLE 60X90IN (DRAPES) ×3 IMPLANT
COVER TIP SHEARS 8 DVNC (MISCELLANEOUS) ×2 IMPLANT
COVER TIP SHEARS 8MM DA VINCI (MISCELLANEOUS) ×1
COVER WAND RF STERILE (DRAPES) IMPLANT
DECANTER SPIKE VIAL GLASS SM (MISCELLANEOUS) IMPLANT
DERMABOND ADVANCED (GAUZE/BANDAGES/DRESSINGS) ×1
DERMABOND ADVANCED .7 DNX12 (GAUZE/BANDAGES/DRESSINGS) ×2 IMPLANT
DRAIN CHANNEL RND F F (WOUND CARE) IMPLANT
DRAPE ARM DVNC X/XI (DISPOSABLE) ×8 IMPLANT
DRAPE COLUMN DVNC XI (DISPOSABLE) ×2 IMPLANT
DRAPE DA VINCI XI ARM (DISPOSABLE) ×4
DRAPE DA VINCI XI COLUMN (DISPOSABLE) ×1
DRAPE SHEET LG 3/4 BI-LAMINATE (DRAPES) ×3 IMPLANT
DRAPE SURG IRRIG POUCH 19X23 (DRAPES) ×3 IMPLANT
DRSG OPSITE POSTOP 4X6 (GAUZE/BANDAGES/DRESSINGS) ×1 IMPLANT
ELECT REM PT RETURN 15FT ADLT (MISCELLANEOUS) ×3 IMPLANT
GAUZE 4X4 16PLY RFD (DISPOSABLE) IMPLANT
GLOVE BIO SURGEON STRL SZ 6 (GLOVE) ×12 IMPLANT
GLOVE BIO SURGEON STRL SZ 6.5 (GLOVE) IMPLANT
GOWN STRL REUS W/ TWL LRG LVL3 (GOWN DISPOSABLE) ×8 IMPLANT
GOWN STRL REUS W/TWL LRG LVL3 (GOWN DISPOSABLE) ×4
HANDLE SUCTION POOLE (INSTRUMENTS) IMPLANT
HOLDER FOLEY CATH W/STRAP (MISCELLANEOUS) ×3 IMPLANT
IRRIG SUCT STRYKERFLOW 2 WTIP (MISCELLANEOUS) ×3
IRRIGATION SUCT STRKRFLW 2 WTP (MISCELLANEOUS) ×2 IMPLANT
KIT PROCEDURE DA VINCI SI (MISCELLANEOUS)
KIT PROCEDURE DVNC SI (MISCELLANEOUS) IMPLANT
KIT TURNOVER KIT A (KITS) IMPLANT
MANIPULATOR UTERINE 4.5 ZUMI (MISCELLANEOUS) ×3 IMPLANT
NDL SPNL 18GX3.5 QUINCKE PK (NEEDLE) IMPLANT
NEEDLE HYPO 22GX1.5 SAFETY (NEEDLE) IMPLANT
NEEDLE SPNL 18GX3.5 QUINCKE PK (NEEDLE) IMPLANT
OBTURATOR OPTICAL STANDARD 8MM (TROCAR) ×1
OBTURATOR OPTICAL STND 8 DVNC (TROCAR) ×2
OBTURATOR OPTICALSTD 8 DVNC (TROCAR) ×2 IMPLANT
PACK ROBOT GYN CUSTOM WL (TRAY / TRAY PROCEDURE) ×3 IMPLANT
PAD POSITIONING PINK XL (MISCELLANEOUS) ×3 IMPLANT
PORT ACCESS TROCAR AIRSEAL 12 (TROCAR) ×2 IMPLANT
PORT ACCESS TROCAR AIRSEAL 5M (TROCAR) ×1
POUCH SPECIMEN RETRIEVAL 10MM (ENDOMECHANICALS) IMPLANT
RETRACTOR WND ALEXIS 18 MED (MISCELLANEOUS) IMPLANT
RTRCTR WOUND ALEXIS 18CM MED (MISCELLANEOUS) ×3
SEAL CANN UNIV 5-8 DVNC XI (MISCELLANEOUS) ×6 IMPLANT
SEAL XI 5MM-8MM UNIVERSAL (MISCELLANEOUS) ×3
SET TRI-LUMEN FLTR TB AIRSEAL (TUBING) ×3 IMPLANT
SUCTION POOLE HANDLE (INSTRUMENTS) ×3
SURGIFLO W/THROMBIN 8M KIT (HEMOSTASIS) IMPLANT
SUT MNCRL AB 4-0 PS2 18 (SUTURE) ×2 IMPLANT
SUT VIC AB 0 CT1 27 (SUTURE) ×3
SUT VIC AB 0 CT1 27XBRD ANTBC (SUTURE) IMPLANT
SUT VIC AB 3-0 SH 27 (SUTURE) ×1
SUT VIC AB 3-0 SH 27XBRD (SUTURE) IMPLANT
SUT VIC AB 4-0 PS2 18 (SUTURE) ×6 IMPLANT
SUT VICRYL 4-0 PS2 18IN ABS (SUTURE) ×2 IMPLANT
SYR 10ML LL (SYRINGE) IMPLANT
TOWEL OR NON WOVEN STRL DISP B (DISPOSABLE) ×3 IMPLANT
TRAP SPECIMEN MUCOUS 40CC (MISCELLANEOUS) IMPLANT
TRAY FOLEY MTR SLVR 16FR STAT (SET/KITS/TRAYS/PACK) ×3 IMPLANT
UNDERPAD 30X30 (UNDERPADS AND DIAPERS) ×3 IMPLANT
WATER STERILE IRR 1000ML POUR (IV SOLUTION) ×3 IMPLANT

## 2019-05-31 NOTE — H&P (Signed)
H&P Note: Gyn-Onc  Consult was requested by Dr. Nelda Marseille for the evaluation of Alexis Fleming 48 y.o. female  CC:  FIbroid uterus, menorrhagia   Assessment/Plan:  Alexis Fleming  is a 48 y.o.  year old with a right upper quadrant mass and fibroid uterus. This mass is likely a subserosal fibroid. It has increased 4-5cm in size over a 5 month period (abeit with scans at 2 different locations).  The MRI abd/pelvis to better characterized the mass suggesting a pedunculated fibroid and not a retroperitoneal sarcoma or soft tissue tumor arising from an alternative structure.  I am recommending robotic assisted total hysterectomy >250gm with bilateral salpingectomy and minilaparotomy for specimen removal (in tact). I discussed surgical risks including  bleeding, infection, damage to internal organs (such as bladder,ureters, bowels), blood clot, reoperation and rehospitalization.  I discussed that continued observation may be reasonable, however it is already symptomatic and will likely not shrink much, including with menopause. Therefore I am recommending surgery now when a MIS approach may be feasible.  I discussed that hysterectomy will result in permanent infertility and the patient is agreeable with this.  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   The patient has been known to have a fibroid uterus for many years.  This was relatively asymptomatic with the exception of gradually progressive menorrhagia over the years.  She noticed an upper abdominal mass in January 2020 and was seen and evaluated with an ultrasound scan performed at [facility] on December 14, 2018.  This revealed a uterus measuring 12.1 x 4.3 x 6.2 cm and endometrium measuring 9 mm, and a mass in the right abdomen measuring 13.4 x 9.3 cm which is likely a pedunculated fibroid.  The right ovary measured 3.8 cm and was  normal the left ovary measured 2.7 cm and was also normal.  She was seen and evaluated in May again by Dr. Nelda Marseille who repeated a pelvic and abdominal ultrasound scan which showed a uterus measuring 12.8 cm in length by 5.2 x 5.6 cm with a 5 mm endometrium.  There was a large hypoechoic solid mass seen in the right mid abdomen to the right of the umbilicus which measured 17.3 x 15.1 x 11 cm and had increased from the previous ultrasound.  Was possibly a pedunculated fibroid.  The left ovary contained a complex cyst with low-level internal echoes measuring 2.5 cm this cyst was avascular.  The right ovary was not visualized.  The patient reports that she had noted progressive increase in size in the mass which was somewhat uncomfortable with sitting and laying flat.  The patient's surgical history is otherwise significant for 2 prior cesarean sections the first of which was by her vertical midline incision due to emergency.  She subsequently developed endometriosis in her C-section incision which was operated in 2015 with Dr. Marlou Starks.  These have been her only prior abdominal surgeries.  The patient is otherwise fairly healthy though she does have asthma for which she has historically been treated in the wintertime with prednisone.  However she does not like the way prednisone makes her feel and last took this in the winter 2019.  She has had 2 prior childbirths prior cesarean section.  She works as a Geneticist, molecular through the current health system.  Her mother has a history of metastatic colon cancer diagnosed in  the [place].  She has a maternal first cousin with history of breast cancer.  She is a non-smoker and nondiabetic.  Her BMI was 25 kg/m.  Current Meds:  Outpatient Encounter Medications as of 04/08/2019  Medication Sig  . albuterol (PROAIR HFA) 108 (90 Base) MCG/ACT inhaler   . albuterol (PROVENTIL) (2.5 MG/3ML) 0.083% nebulizer solution Inhale into the lungs.  Marland Kitchen EPINEPHrine 0.3 mg/0.3 mL IJ SOAJ  injection as directed   No facility-administered encounter medications on file as of 04/08/2019.     Allergy:  Allergies  Allergen Reactions  . Shrimp [Shellfish Allergy] Anaphylaxis and Swelling  . Betadine [Povidone Iodine] Rash    Leaves rash on body    Social Hx:   Social History   Socioeconomic History  . Marital status: Married    Spouse name: Not on file  . Number of children: Not on file  . Years of education: Not on file  . Highest education level: Not on file  Occupational History  . Not on file  Social Needs  . Financial resource strain: Not on file  . Food insecurity    Worry: Not on file    Inability: Not on file  . Transportation needs    Medical: Not on file    Non-medical: Not on file  Tobacco Use  . Smoking status: Never Smoker  . Smokeless tobacco: Never Used  Substance and Sexual Activity  . Alcohol use: No  . Drug use: No  . Sexual activity: Yes  Lifestyle  . Physical activity    Days per week: Not on file    Minutes per session: Not on file  . Stress: Not on file  Relationships  . Social Herbalist on phone: Not on file    Gets together: Not on file    Attends religious service: Not on file    Active member of club or organization: Not on file    Attends meetings of clubs or organizations: Not on file    Relationship status: Not on file  . Intimate partner violence    Fear of current or ex partner: Not on file    Emotionally abused: Not on file    Physically abused: Not on file    Forced sexual activity: Not on file  Other Topics Concern  . Not on file  Social History Narrative  . Not on file    Past Surgical Hx:  Past Surgical History:  Procedure Laterality Date  . CESAREAN SECTION  2003/2012  . IRRIGATION AND DEBRIDEMENT ABSCESS Right 01/20/2014   Procedure: EXCISION RIGHT LOWER ABDOMINAL WALL MASS;  Surgeon: Merrie Roof, MD;  Location: [facility];  Service: General;  Laterality: Right;    Past Medical Hx:  Past  Medical History:  Diagnosis Date  . Eczema   . Endometriosis   . Hypertension    last 2 weeks of pregnancy - 3 years ago   . Seasonal allergies   . Thyroid goiter    bipsy done here 2013    Past Gynecological History:  No hx of abnormal paps, endometriosis in cesarean section incision. Cesarean sections x 2 Patient's last menstrual period was 05/07/2019 (approximate).  Family Hx:  Family History  Problem Relation Age of Onset  . Colon cancer Mother   . Heart disease Father   . Breast cancer Cousin     Review of Systems:  Constitutional  Feels well,    ENT Normal appearing ears and nares bilaterally Skin/Breast  No rash, sores, jaundice, itching, dryness Cardiovascular  No chest pain, shortness of breath, or edema  Pulmonary  No cough or wheeze.  Gastro Intestinal  No nausea, vomitting, or diarrhoea. No bright red blood per rectum, no abdominal pain, change in bowel movement, or constipation. + abdominal distension Genito Urinary  No frequency, urgency, dysuria, + menorrhagia Musculo Skeletal  No myalgia, arthralgia, joint swelling or pain  Neurologic  No weakness, numbness, change in gait,  Psychology  No depression, anxiety, insomnia.   Vitals:  Blood pressure (!) 148/93, pulse 81, temperature 98.2 F (36.8 C), temperature source Oral, resp. rate 16, last menstrual period 05/07/2019, SpO2 100 %.  Physical Exam: WD in NAD Neck  Supple NROM, without any enlargements.  Lymph Node Survey No cervical supraclavicular or inguinal adenopathy Cardiovascular  Pulse normal rate, regularity and rhythm. S1 and S2 normal.  Lungs  Clear to auscultation bilateraly, without wheezes/crackles/rhonchi. Good air movement.  Skin  No rash/lesions/breakdown  Psychiatry  Alert and oriented to person, place, and time  Abdomen  Normoactive bowel sounds, abdomen soft, non-tender and nonobese without evidence of hernia. There is a palpable, mobile, spherical firm mass in the right  upper quadrant/mid abdomen Back No CVA tenderness Genito Urinary  Vulva/vagina: Normal external female genitalia.  No lesions. No discharge or bleeding.  Bladder/urethra:  No lesions or masses, well supported bladder  Vagina: normal  Cervix: Normal appearing, no lesions.  Uterus:  narrow mobile, no parametrial involvement or nodularity. Unclear relationship between uterus and upper abdominal mass  Adnexa: no discretely palpable masses on bimanual exam Rectal  deferred Extremities  No bilateral cyanosis, clubbing or edema.   Thereasa Solo, MD  05/31/2019, 7:07 AM

## 2019-05-31 NOTE — Transfer of Care (Signed)
Immediate Anesthesia Transfer of Care Note  Patient: Edwyna Shell  Procedure(s) Performed: XI ROBOTIC ASSISTED TOTAL HYSTERECTOMY UTERUS GREATER THAN 250 GRAMS (N/A ) XI ROBOTIC ASSISTED SALPINGECTOMY AND MINI LAPAROTOMY (Bilateral )  Patient Location: PACU  Anesthesia Type:General  Level of Consciousness: awake, alert , oriented and patient cooperative  Airway & Oxygen Therapy: Patient Spontanous Breathing and Patient connected to nasal cannula oxygen  Post-op Assessment: Report given to RN, Post -op Vital signs reviewed and stable and Patient moving all extremities  Post vital signs: Reviewed and stable  Last Vitals:  Vitals Value Taken Time  BP    Temp    Pulse 78 05/31/19 1021  Resp 23 05/31/19 1021  SpO2 100 % 05/31/19 1021  Vitals shown include unvalidated device data.  Last Pain:  Vitals:   05/31/19 0618  TempSrc: Oral         Complications: No apparent anesthesia complications

## 2019-05-31 NOTE — Progress Notes (Signed)
Patient here alone for a pre-operative appointment prior to her scheduled surgery on May 31, 2019. She is scheduled for a robotic assisted total hysterectomy, bilateral salpingectomy, mini laparotomy for specimen delivery. She had her pre-admission testing appointment earlier at [facility]. The surgery was discussed in detail.  See after visit summary for additional details. Visual aids used to discuss items related to surgery including the incentive spirometer, sequential compression stockings, foley catheter, IV pump, multi-modal pain regimen including tylenol, photo of the surgical robot, female reproductive system to discuss surgery in detail.   Discussed post-op pain management in detail including the aspects of the enhanced recovery pathway.  Advised her that a new prescription would be sent in for oxycodone and it is only to be used for after her upcoming surgery.  We discussed the use of tylenol post-op and to monitor for a maximum of 4,000 mg in a 24 hour period. Also prescribed sennakot to be used after surgery and to hold if having loose stools. Discussed bowel regimen in detail.   Discussed the use of lovenox pre-op, SCDs, and measures to take at home to prevent DVT including frequent mobility.  Reportable signs and symptoms of DVT discussed. Post-operative instructions discussed and expectations for after surgery. Incisional care discussed as well including reportable signs and symptoms including erythema, drainage, wound separation.   15 minutes spent with the patient. Verbalizing understanding of material discussed. No needs or concerns voiced at the end of the visit. Advised patient or family to call for any needs.  Advised that her post-operative medications had been prescribed and could be picked up at any time.

## 2019-05-31 NOTE — Progress Notes (Signed)
Discharge paperwork discussed with patient and husband via telephone. Updated husband on patient and that she would need to tolerate crackers and fluids as well as void before discharge. Patient and husband verbalized understanding. RN to call patients husband when patient has met all discharge requirements and is ready for discharge. Patients husband Jori Moll verbalized understanding.

## 2019-05-31 NOTE — Op Note (Signed)
OPERATIVE NOTE 05/31/19  Surgeon: Donaciano Eva   Assistants: Dr Lahoma Crocker (an MD assistant was necessary for tissue manipulation, management of robotic instrumentation, retraction and positioning due to the complexity of the case and hospital policies).   Anesthesia: General endotracheal anesthesia  ASA Class: 3   Pre-operative Diagnosis: fibroid uterus, rapidly enlarging pedunculated fibroid  Post-operative Diagnosis: same and benign fibroid and stage IV endometriosis.  Operation: Robotic-assisted laparoscopic total hysterectomy >250gm with bilateral salpingectomy   Surgeon: Donaciano Eva  Assistant Surgeon: Lahoma Crocker MD Assistant: Joylene John, NP  Anesthesia: GET  Urine Output: 150cc  Operative Findings:  : 10cm uterus with normal appearing ovaries, 17cm pedunculated fibroid emanating from right cornua. There were adhesions and dense vascularity between the bladder and lower uterine segment. Powder burn lesions in the posterior cul de sac and obliteration of the posterior cul de sac consistent with stage IV endometriosis.  Benign fibroid on frozen section.  Estimated Blood Loss:  less than 50 mL      Total IV Fluids: 800 ml         Specimens: uterus with cervix and bilateral fallopian tubes         Complications:  None; patient tolerated the procedure well.         Disposition: PACU - hemodynamically stable.  Procedure Details  The patient was seen in the Holding Room. The risks, benefits, complications, treatment options, and expected outcomes were discussed with the patient.  The patient concurred with the proposed plan, giving informed consent.  The site of surgery properly noted/marked. The patient was identified as Alexis Fleming and the procedure verified as a Robotic-assisted hysterectomy >250gm with bilateral salpingectomy. A Time Out was held and the above information confirmed.  After induction of anesthesia, the patient was  draped and prepped in the usual sterile manner. Pt was placed in supine position after anesthesia and draped and prepped in the usual sterile manner. The abdominal drape was placed after the CholoraPrep had been allowed to dry for 3 minutes.  Her arms were tucked to her side with all appropriate precautions.  The shoulders were stabilized with padded shoulder blocks applied to the acromium processes.  The patient was placed in the semi-lithotomy position in [place].  The perineum was prepped with Betadine. The patient was then prepped. Foley catheter was placed.  A sterile speculum was placed in the vagina.  The cervix was grasped with a single-tooth tenaculum and dilated with Kennon Rounds dilators.  The ZUMI uterine manipulator with a medium colpotomizer ring was placed without difficulty.  A pneum occluder balloon was placed over the manipulator.  OG tube placement was confirmed and to suction.   Next, a 5 mm skin incision was made 1 cm below the subcostal margin in the midclavicular line.  The 5 mm Optiview port and scope was used for direct entry.  Opening pressure was under 10 mm CO2.  The abdomen was insufflated and the findings were noted as above.   At this point and all points during the procedure, the patient's intra-abdominal pressure did not exceed 15 mmHg. Next, a 10 mm skin incision was made in the umbilicus and a right and left port was placed about 10 cm lateral to the robot port on the right and left side.  All ports were placed under direct visualization.  The patient was placed in steep Trendelenburg.  Bowel was folded away into the upper abdomen.  The robot was docked in the normal manner.  The hysterectomy was started after the round ligament on the right side was incised and the retroperitoneum was entered and the pararectal space was developed.  The ureter was noted to be on the medial leaf of the broad ligament.  The peritoneum above the ureter was incised and stretched and the  utero-ovarian ligament was skeletonized, cauterized and cut. The fallopian tube was dissected off of the ovary and kept with the uterine specimen. The posterior peritoneum was taken down to the level of the KOH ring. This was challenging due to the stage IV endometriosis necessitating rectovaginal septal development.  The anterior peritoneum was also taken down. This was also challenging due to the high degree of vascularity between the bladder flap and lower uterine segment. The bladder flap was created to the level of the KOH ring.  The uterine artery on the right side was skeletonized, cauterized and cut in the normal manner.  A similar procedure was performed on the left.  The colpotomy was made and the uterus, cervix, bilateral tubes were amputated.  Pedicles were inspected and excellent hemostasis was achieved.    The colpotomy at the vaginal cuff was closed with Vicryl on a CT1 needle in a running manner. The rectovaginal septum was closed at the peritoneum to close the deadspace and reinforce hemostasis in the posterior cul de sac.  Irrigation was used and excellent hemostasis was achieved.  At this point in the robotic procedure was completed.  Robotic instruments were removed under direct visulaization.  The robot was undocked.   A 6cm suprapubic low transverse incision was made with the scalpel. The subcutaneous skin was opened with the bovie. The fascia was opened with the bovie transversely and the rectus muscles were dissected off of the fascia inferiorally and superiorally. The peritoneum was opened sharply in the midline. The peritoneal incision was extended. The uterine specimen was grasped with multi-tooth tenacula and was core morcellated into a single piece of fibroid to retrieved through the incision.  There was no fragmentation of the specimen and a single piece of elongated tissue was created. The fascia was closed with running 0-vicryl. The subcutaneous fat was closed with 2-0 vicryl.  20cc of exparel mixed with 20cc of marcaine was infiltrated into the incision. The incision was closed at the skin with monocryl and dermabond.   The 10 mm ports were closed with Vicryl on a UR-5 needle and the fascia was closed with 0 Vicryl on a UR-5 needle.  The skin was closed with 4-0 Vicryl in a subcuticular manner.  Dermabond was applied.  Sponge, lap and needle counts correct x 2..  The vagina was swabbed with  minimal bleeding noted which was made hemostatic with interrupted sutures of 3-0 vicryl and 4-0 monocryl.   All instrument and needle counts were correct x  3.   The patient was transferred to the recovery room in a stable condition.  Donaciano Eva, MD

## 2019-05-31 NOTE — Discharge Instructions (Addendum)
After Your Surgery  The information in this section will tell you what to expect after your surgery, both during your stay and after you leave. You will learn how to safely recover from your surgery. Write down any questions you have and be sure to ask your doctor or nurse. What to Expect When you wake up after your surgery, you will be in the [facility] ([facility]) or your recovery room. A nurse will be monitoring your body temperature, blood pressure, pulse, and oxygen levels. You may have a urinary catheter in your bladder to help monitor the amount of urine you are making. It should come out before you go home. You will also have compression boots on your lower legs to help your circulation. Your pain medication will be given through an IV line or in tablet form. If you are having pain, tell your nurse. Your nurse will tell you how to recover from your surgery. Below are examples of ways you can help yourself recover safely.  You will be encouraged to walk with the help of your nurse or physical therapist. We will give you medication to relieve pain. Walking helps reduce the risk for blood clots and pneumonia. It also helps to stimulate your bowels so they begin working again.  Use your incentive spirometer. This will help your lungs expand, which prevents pneumonia. For more information, read How to Use Your Incentive Spirometer. Commonly Asked Questions Will I have pain after surgery? Yes, you will have some pain after your surgery, especially in the first few days. Your doctor and nurse will ask you about your pain often. You will be given medication to manage your pain as needed. If your pain is not relieved, please tell your doctor or nurse. It is important to control your pain so you can cough, breathe deeply, use your incentive spirometer, and get out of bed and walk. Will I be able to eat? Yes, you will be able to eat a regular diet or eat as tolerated. You should start with  foods that are soft and easy to digest such as apple sauce and chicken noodle soup. Eat small meals frequently, and then advance to regular foods. If you experience bloating, gas, or cramps, limit high-fiber foods, including whole grain breads and cereal, nuts, seeds, salads, fresh fruit, broccoli, cabbage, and cauliflower. Will I have pain when I am home? The length of time each person has pain or discomfort varies. You may still have some pain when you go home and will probably be taking pain medication. Follow the guidelines below.  Take your medications as directed and as needed.  Call your doctor if the medication prescribed for you doesn't relieve your pain.  Don't drive or drink alcohol while you're taking prescription pain medication.  As your incision heals, you will have less pain and need less pain medication. A mild pain reliever such as acetaminophen (Tylenol) or ibuprofen (Advil) will relieve aches and discomfort. However, large quantities of acetaminophen may be harmful to your liver. Don't take more acetaminophen than the amount directed on the bottle or as instructed by your doctor or nurse.  Pain medication should help you as you resume your normal activities. Take enough medication to do your exercises comfortably. Pain medication is most effective 30 to 45 minutes after taking it.  Keep track of when you take your pain medication. Taking it when your pain first begins is more effective than waiting for the pain to get worse. Pain medication may  cause constipation (having fewer bowel movements than what is normal for you). How can I prevent constipation?  Go to the bathroom at the same time every day. Your body will get used to going at that time.  If you feel the urge to go, don't put it off. Try to use the bathroom 5 to 15 minutes after meals.  After breakfast is a good time to move your bowels. The reflexes in your colon are strongest at this time.  Exercise, if you  can. Walking is an excellent form of exercise.  Drink 8 (8-ounce) glasses (2 liters) of liquids daily, if you can. Drink water, juices, soups, ice cream shakes, and other drinks that don't have caffeine. Drinks with caffeine, such as coffee and soda, pull fluid out of the body.  Slowly increase the fiber in your diet to 25 to 35 grams per day. Fruits, vegetables, whole grains, and cereals contain fiber. If you have an ostomy or have had recent bowel surgery, check with your doctor or nurse before making any changes in your diet.  Both over-the-counter and prescription medications are available to treat constipation. Start with 1 of the following over-the-counter medications first: o Docusate sodium (Colace) 100 mg. Take ___1__ capsules _2____ times a day. This is a stool softener that causes few side effects. Don't take it with mineral oil. o Polyethylene glycol (MiraLAX) 17 grams daily. o Senna (Senokot) 2 tablets at bedtime. This is a stimulant laxative, which can cause cramping.  If you haven't had a bowel movement in 2 days, call your doctor or nurse. Can I shower? Yes, you should shower 24 hours after your surgery. Be sure to shower every day. Taking a warm shower is relaxing and can help decrease muscle aches. Use soap when you shower and gently wash your incision. Pat the areas dry with a towel after showering, and leave your incision uncovered (unless there is drainage). Call your doctor if you see any redness or drainage from your incision. Don't take tub baths until you discuss it with your doctor at the first appointment after your surgery. How do I care for my incisions? You will have several small incisions on your abdomen. The incisions are closed with Steri-Strips or Dermabond. You may also have square white dressings on your incisions (Prim[place]). You can remove these in the shower 24 hours after your surgery. You should clean your incisions with soap and water. If you go home  with Steri-Strips on your incision, they will loosen and may fall off by themselves. If they haven't fallen off within 10 days, you can remove them. If you go home with Dermabond over your sutures (stitches), it will also loosen and peel off. What are the most common symptoms after a hysterectomy? It's common for you to have some vaginal spotting or light bleeding. You should monitor this with a pad or a panty liner. If you have having heavy bleeding (bleeding through a pad or liner every 1 to 2 hours), call your doctor right away. It's also common to have some discomfort after surgery from the air that was pumped into your abdomen during surgery. To help with this, walk, drink plenty of liquids and make sure to take the stool softeners you received. When is it safe for me to drive? You may resume driving 2 weeks after surgery, as long as you are not taking pain medication that may make you drowsy. When can I resume sexual activity? Do not place anything in your vagina  or have vaginal intercourse for 8 weeks after your surgery. Some people will need to wait longer than 8 weeks, so speak with your doctor before resuming sexual intercourse. Will I be able to travel? Yes, you can travel. If you are traveling by plane within a few weeks after your surgery, make sure you get up and walk every hour. Be sure to stretch your legs, drink plenty of liquids, and keep your feet elevated when possible. Will I need any supplies? Most people do not need any supplies after the surgery. In the rare case that you do need supplies, such as tubes or drains, your nurse will order them for you. When can I return to work? The time it takes to return to work depends on the type of work you do, the type of surgery you had, and how fast your body heals. Most people can return to work about 2 to 4 weeks after the surgery. What exercises can I do? Exercise will help you gain strength and feel better. Walking and stair climbing  are excellent forms of exercise. Gradually increase the distance you walk. Climb stairs slowly, resting or stopping as needed. Ask your doctor or nurse before starting more strenuous exercises. When can I lift heavy objects? Most people should not lift anything heavier than 10 pounds (4.5 kilograms) for at least 4 weeks after surgery. Speak with your doctor about when you can do heavy lifting. How can I cope with my feelings? After surgery for a serious illness, you may have new and upsetting feelings. Many people say they felt weepy, sad, worried, nervous, irritable, and angry at one time or another. You may find that you can't control some of these feelings. If this happens, it's a good idea to seek emotional support. The first step in coping is to talk about how you feel. Family and friends can help. Your nurse, doctor, and social worker can reassure, support, and guide you. It's always a good idea to let these professionals know how you, your family, and your friends are feeling emotionally. Many resources are available to patients and their families. Whether you're in the hospital or at home, the nurses, doctors, and social workers are here to help you and your family and friends handle the emotional aspects of your illness. When is my first appointment after surgery? Your first appointment after surgery will be 2 to 4 weeks after surgery. Your nurse will give you instructions on how to make this appointment, including the phone number to call. What if I have other questions? If you have any questions or concerns, please talk with your doctor or nurse. You can reach them Monday through Friday from 9:00 am to 5:00 pm. After 5:00 pm, during the weekend, and on holidays, call [phone] and ask for the doctor on call for your doctor.   Have a temperature of 101 F (38.3 C) or higher  Have pain that does not get better with pain medication  Have redness, drainage, or swelling from your  incisions  Return to work: 4 weeks (2 weeks with physical restrictions).  Activity: 1. Be up and out of the bed during the day.  Take a nap if needed.  You may walk up steps but be careful and use the hand rail.  Stair climbing will tire you more than you think, you may need to stop part way and rest.   2. No lifting or straining for 4 weeks.  3. No driving for 1 weeks.  Do Not drive if you are taking narcotic pain medicine.  4. Shower daily.  Use soap and water on your incision and pat dry; don't rub.   5. No sexual activity and nothing in the vagina for 8 weeks.  Medications:  - Take ibuprofen and tylenol first line for pain control. Take these regularly (every 6 hours) to decrease the build up of pain.  - If necessary, for severe pain not relieved by ibuprofen, contact Dr Serita Grit office and you will be prescribed percocet.  - While taking percocet you should take sennakot every night to reduce the likelihood of constipation. If this causes diarrhea, stop its use.  Diet: 1. Low sodium Heart Healthy Diet is recommended.  2. It is safe to use a laxative if you have difficulty moving your bowels.   Wound Care: 1. Keep clean and dry.  Shower daily. 2. You can get the dressing wet in the shower, but avoid tub baths. 3. Remove the dressing between 5 and 7 days after your surgery (1 week after your operation). 4. After the dressing is removed you can get the incision wet in the shower, however continue to avoid tub baths until advised otherwise by your surgeon at follow-up.    Reasons to call the Doctor:   Fever - Oral temperature greater than 100.4 degrees Fahrenheit  Foul-smelling vaginal discharge  Difficulty urinating  Nausea and vomiting  Increased pain at the site of the incision that is unrelieved with pain medicine.  Difficulty breathing with or without chest pain  New calf pain especially if only on one side  Sudden, continuing increased vaginal bleeding with or  without clots.   Follow-up: 1. See Everitt Amber in 4 weeks.  Contacts: For questions or concerns you should contact:  Dr. Everitt Amber at [phone] After hours and on week-ends call [phone] and ask to speak to the physician on call for Gynecologic Oncology

## 2019-05-31 NOTE — Anesthesia Postprocedure Evaluation (Signed)
Anesthesia Post Note  Patient: Alexis Fleming  Procedure(s) Performed: XI ROBOTIC ASSISTED TOTAL HYSTERECTOMY UTERUS GREATER THAN 250 GRAMS (N/A ) XI ROBOTIC ASSISTED SALPINGECTOMY AND MINI LAPAROTOMY (Bilateral )     Patient location during evaluation: PACU Anesthesia Type: General Level of consciousness: awake and alert Pain management: pain level controlled Vital Signs Assessment: post-procedure vital signs reviewed and stable Respiratory status: spontaneous breathing, nonlabored ventilation and respiratory function stable Cardiovascular status: blood pressure returned to baseline and stable Postop Assessment: no apparent nausea or vomiting Anesthetic complications: no    Last Vitals:  Vitals:   05/31/19 1115 05/31/19 1130  BP: 108/64 (!) 100/57  Pulse: 77 73  Resp: 14 19  Temp:  36.4 C  SpO2: 99% 93%    Last Pain:  Vitals:   05/31/19 1309  TempSrc:   PainSc: Jessie

## 2019-05-31 NOTE — Anesthesia Procedure Notes (Signed)
Procedure Name: Intubation Date/Time: 05/31/2019 7:42 AM Performed by: Myna Bright, CRNA Pre-anesthesia Checklist: Patient identified, Emergency Drugs available, Patient being monitored and Suction available Patient Re-evaluated:Patient Re-evaluated prior to induction Oxygen Delivery Method: Circle system utilized Preoxygenation: Pre-oxygenation with 100% oxygen Induction Type: IV induction Ventilation: Mask ventilation without difficulty Laryngoscope Size: Mac and 3 Grade View: Grade I Tube type: Oral Tube size: 7.0 mm Number of attempts: 1 Airway Equipment and Method: Stylet Placement Confirmation: positive ETCO2,  ETT inserted through vocal cords under direct vision and breath sounds checked- equal and bilateral Secured at: 21 cm Tube secured with: Tape Dental Injury: Teeth and Oropharynx as per pre-operative assessment

## 2019-06-01 ENCOUNTER — Encounter (HOSPITAL_COMMUNITY): Payer: Self-pay | Admitting: Gynecologic Oncology

## 2019-06-01 ENCOUNTER — Telehealth: Payer: Self-pay

## 2019-06-01 NOTE — Telephone Encounter (Signed)
Alexis Fleming states that she is doing well. She is using Advil alt with Oxycodone for her pain. Incisions are dry and intact. Eating and drinking well. She is urinating well and passing gas. She has difficulty with constipation after surgeries. She has taken senokot- S 2 tabs last night.  Told her she can take it BID and she could add Miralax  1 capful bid prn as well.  Alexis Fleming requested information on the weight of fibroids .  Told her that that could be reviewed at her post op appointment 06-24-19. She also wanted to know how the surgery went yesterday.  She wanted to speak with Dr. Denman George  And requested to have her call her. Told pt she is in the OR today and would not be out of the office until next week. Told her that Joylene John, NP was in the case. Will let Lenna Sciara know that Alexis Fleming would like to speak with her today or tomorrow about her questions, not at her 06-24-19 post op appointment.

## 2019-06-03 ENCOUNTER — Telehealth: Payer: Self-pay | Admitting: Gynecologic Oncology

## 2019-06-03 DIAGNOSIS — R1900 Intra-abdominal and pelvic swelling, mass and lump, unspecified site: Secondary | ICD-10-CM

## 2019-06-03 DIAGNOSIS — J029 Acute pharyngitis, unspecified: Secondary | ICD-10-CM | POA: Diagnosis not present

## 2019-06-03 MED ORDER — OXYCODONE HCL 5 MG PO TABS: 5.0000 mg | ORAL_TABLET | ORAL | 0 refills | Status: DC | PRN

## 2019-06-03 NOTE — Telephone Encounter (Signed)
Called to check on patient's post-op status.  Informed her of final path.  She states she is having moderate abdominal incisional pain at times. She is taking the ibup and tylenol and using two oxycodone a day.  Requesting a refill since she only has five left.  States bowels and bladder are functioning.  No concerns voiced. Advised to call for any questions or concerns.

## 2019-06-10 DIAGNOSIS — Z0000 Encounter for general adult medical examination without abnormal findings: Secondary | ICD-10-CM | POA: Diagnosis not present

## 2019-06-24 ENCOUNTER — Inpatient Hospital Stay: Payer: BC Managed Care – PPO | Attending: Gynecologic Oncology | Admitting: Gynecologic Oncology

## 2019-06-24 ENCOUNTER — Encounter: Payer: Self-pay | Admitting: Gynecologic Oncology

## 2019-06-24 ENCOUNTER — Other Ambulatory Visit: Payer: Self-pay

## 2019-06-24 VITALS — BP 131/80 | HR 92 | Temp 98.2°F | Resp 18 | Ht 64.0 in | Wt 140.7 lb

## 2019-06-24 DIAGNOSIS — Z79899 Other long term (current) drug therapy: Secondary | ICD-10-CM | POA: Diagnosis not present

## 2019-06-24 DIAGNOSIS — Z90710 Acquired absence of both cervix and uterus: Secondary | ICD-10-CM | POA: Insufficient documentation

## 2019-06-24 DIAGNOSIS — D271 Benign neoplasm of left ovary: Secondary | ICD-10-CM | POA: Diagnosis not present

## 2019-06-24 DIAGNOSIS — Z90722 Acquired absence of ovaries, bilateral: Secondary | ICD-10-CM | POA: Insufficient documentation

## 2019-06-24 DIAGNOSIS — D251 Intramural leiomyoma of uterus: Secondary | ICD-10-CM

## 2019-06-24 NOTE — Patient Instructions (Signed)
Dr Denman George has written you out from work until September 15th, 2020.   Dr Denman George recommends that you avoid intercourse until October 15th, 2020. If you are still having bleeding at that time, you should continue to refrain from intercourse.  Please follow-up with Dr Nelda Marseille for annual wellness exams.  No cancer was found in the uterus, cervix or fallopian tubes. Your ovaries were not removed.  If you have questions related to your surgery, please call Dr Denman George at [phone].

## 2019-06-24 NOTE — Progress Notes (Signed)
Follow-up Note: Gyn-Onc  Consult was initially requested by Dr. Nelda Fleming for the evaluation of Alexis Fleming 48 y.o. female  CC:  Chief Complaint  Patient presents with  . Fibroids    postop    Assessment/Plan:  [name]  is a 48 y.o.  year old with a history of a right upper quadrant mass and fibroid uterus who is status post robotic assisted total hysterectomy for uterus greater than 250 g, bilateral salpingectomy on May 31, 2019.  Final pathology confirmed a benign leiomyoma.  She is doing overall fairly well postoperatively but is somewhat fatigued and sore still from her extensive surgery.  I have written her off from work until August 02, 2019.  She has some delayed healing at the vaginal cuff with persistent suture material present.  I am recommending refraining from intercourse for an additional 2 months.  She will follow-up with Dr. Nelda Fleming for long-term routine gynecologic care.  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   The patient has been known to have a fibroid uterus for many years.  This was relatively asymptomatic with the exception of gradually progressive menorrhagia over the years.  She noticed an upper abdominal mass in January 2020 and was seen and evaluated with an ultrasound scan performed at [facility] on December 14, 2018.  This revealed a uterus measuring 12.1 x 4.3 x 6.2 cm and endometrium measuring 9 mm, and a mass in the right abdomen measuring 13.4 x 9.3 cm which is likely a pedunculated fibroid.  The right ovary measured 3.8 cm and was normal the left ovary measured 2.7 cm and was also normal.  She was seen and evaluated in May again by Dr. Nelda Fleming who repeated a pelvic and abdominal ultrasound scan which showed a uterus measuring 12.8 cm in length by 5.2 x 5.6 cm with a 5 mm endometrium.  There was a large hypoechoic solid mass seen in the  right mid abdomen to the right of the umbilicus which measured 17.3 x 15.1 x 11 cm and had increased from the previous ultrasound.  Was possibly a pedunculated fibroid.  The left ovary contained a complex cyst with low-level internal echoes measuring 2.5 cm this cyst was avascular.  The right ovary was not visualized.  The patient reports that she had noted progressive increase in size in the mass which was somewhat uncomfortable with sitting and laying flat.  The patient's surgical history is otherwise significant for 2 prior cesarean sections the first of which was by her vertical midline incision due to emergency.  She subsequently developed endometriosis in her C-section incision which was operated in 2015 with Dr. Marlou Fleming.  These have been her only prior abdominal surgeries.  The patient is otherwise fairly healthy though she does have asthma for which she has historically been treated in the wintertime with prednisone.  However she does not like the way prednisone makes her feel and last took this in the winter 2019.  She has had 2 prior childbirths prior cesarean section.  She works as a Geneticist, molecular through the current health system.  Her mother has a history of metastatic colon cancer diagnosed in the [place].  She has a maternal first cousin with history of breast cancer.  She is a non-smoker and nondiabetic.  Her BMI was 25 kg/m.  MRI of the abdomen and  pelvis confirmed a pedunculated fibroid appearing mass from the uterine fundus.  Interval Hx:   On May 31, 2019 she underwent a robotic assisted total hysterectomy for uterus greater than 250 g with bilateral salpingectomy.  Intraoperative findings were significant for 10 cm uterus with normal-appearing ovaries bilaterally.  There was a 17 cm pedunculated fibroid emanating from the right cornua.  There were adhesions and dense vascularity between the bladder and lower uterine segment.  There were part of bone lesions in the posterior cul-de-sac  and obliteration of the posterior cul-de-sac consistent with stage IV endometriosis.  The fibroid was benign on frozen section.  She required a mini laparotomy for specimen delivery.  Postoperatively she did fairly well however does have some increasing lower abdominal tenderness.  She has a small amount of vaginal spotting.  She denies fevers or chills.  She does not feel that she will be ready to return to work on August 24 as initially planned.  Current Meds:  Outpatient Encounter Medications as of 06/24/2019  Medication Sig  . albuterol (PROVENTIL) (2.5 MG/3ML) 0.083% nebulizer solution Take 2.5 mg by nebulization every 4 (four) hours as needed for wheezing or shortness of breath.   Marland Kitchen ibuprofen (ADVIL) 600 MG tablet Take 1 tablet (600 mg total) by mouth every 6 (six) hours as needed for moderate pain. For AFTER surgery  . oxyCODONE (OXY IR/ROXICODONE) 5 MG immediate release tablet Take 1 tablet (5 mg total) by mouth every 4 (four) hours as needed for severe pain. For AFTER surgery, do not take and drive  . senna-docusate (SENOKOT-S) 8.6-50 MG tablet Take 2 tablets by mouth at bedtime. For AFTER surgery, do not take if having diarrhea   No facility-administered encounter medications on file as of 06/24/2019.     Allergy:  Allergies  Allergen Reactions  . Shrimp [Shellfish Allergy] Anaphylaxis and Swelling  . Betadine [Povidone Iodine] Rash    Leaves rash on body    Social Hx:   Social History   Socioeconomic History  . Marital status: Married    Spouse name: Not on file  . Number of children: Not on file  . Years of education: Not on file  . Highest education level: Not on file  Occupational History  . Not on file  Social Needs  . Financial resource strain: Not on file  . Food insecurity    Worry: Not on file    Inability: Not on file  . Transportation needs    Medical: Not on file    Non-medical: Not on file  Tobacco Use  . Smoking status: Never Smoker  . Smokeless  tobacco: Never Used  Substance and Sexual Activity  . Alcohol use: No  . Drug use: No  . Sexual activity: Yes  Lifestyle  . Physical activity    Days per week: Not on file    Minutes per session: Not on file  . Stress: Not on file  Relationships  . Social Herbalist on phone: Not on file    Gets together: Not on file    Attends religious service: Not on file    Active member of club or organization: Not on file    Attends meetings of clubs or organizations: Not on file    Relationship status: Not on file  . Intimate partner violence    Fear of current or ex partner: Not on file    Emotionally abused: Not on file    Physically abused: Not on file  Forced sexual activity: Not on file  Other Topics Concern  . Not on file  Social History Narrative  . Not on file    Past Surgical Hx:  Past Surgical History:  Procedure Laterality Date  . CESAREAN SECTION  2003/2012  . IRRIGATION AND DEBRIDEMENT ABSCESS Right 01/20/2014   Procedure: EXCISION RIGHT LOWER ABDOMINAL WALL MASS;  Surgeon: Merrie Roof, MD;  Location: [facility];  Service: General;  Laterality: Right;  . ROBOTIC ASSISTED TOTAL HYSTERECTOMY N/A 05/31/2019   Procedure: XI ROBOTIC ASSISTED TOTAL HYSTERECTOMY UTERUS GREATER THAN 250 GRAMS;  Surgeon: Everitt Amber, MD;  Location: [facility];  Service: Gynecology;  Laterality: N/A;  . XI ROBOTIC ASSISTED SALPINGECTOMY Bilateral 05/31/2019   Procedure: XI ROBOTIC ASSISTED SALPINGECTOMY AND MINI LAPAROTOMY;  Surgeon: Everitt Amber, MD;  Location: [facility];  Service: Gynecology;  Laterality: Bilateral;    Past Medical Hx:  Past Medical History:  Diagnosis Date  . Eczema   . Endometriosis   . Hypertension    last 2 weeks of pregnancy - 3 years ago   . Seasonal allergies   . Thyroid goiter    bipsy done here 2013    Past Gynecological History:  No hx of abnormal paps, endometriosis in cesarean section incision. Cesarean sections x 2 No LMP recorded.  Family Hx:  Family  History  Problem Relation Age of Onset  . Colon cancer Mother   . Heart disease Father   . Breast cancer Cousin     Review of Systems:  Constitutional  Feels well,    ENT Normal appearing ears and nares bilaterally Skin/Breast  No rash, sores, jaundice, itching, dryness Cardiovascular  No chest pain, shortness of breath, or edema  Pulmonary  No cough or wheeze.  Gastro Intestinal  No nausea, vomitting, or diarrhoea. No bright red blood per rectum, no abdominal pain, change in bowel movement, or constipation. Abdominal tenderness left lower quadrant.  Genito Urinary  No frequency, urgency, dysuria, spotting  Musculo Skeletal  No myalgia, arthralgia, joint swelling or pain  Neurologic  No weakness, numbness, change in gait,  Psychology  No depression, anxiety, insomnia.   Vitals:  Blood pressure 131/80, pulse 92, temperature 98.2 F (36.8 C), temperature source Oral, resp. rate 18, height 5\' 4"  (1.626 m), weight 140 lb 11.2 oz (63.8 kg), SpO2 100 %.  Physical Exam: WD in NAD Neck  Supple NROM, without any enlargements.  Lymph Node Survey No cervical supraclavicular or inguinal adenopathy Cardiovascular  Pulse normal rate, regularity and rhythm. S1 and S2 normal.  Lungs  Clear to auscultation bilateraly, without wheezes/crackles/rhonchi. Good air movement.  Skin  No rash/lesions/breakdown  Psychiatry  Alert and oriented to person, place, and time  Abdomen  Normoactive bowel sounds, abdomen soft, non-tender and nonobese without evidence of hernia. Well healed incisions. Back No CVA tenderness Genito Urinary  Vaginal cuff in tact but there is some gapping between sutures which remain visible. No active bleeding. Palpably in tact.  Rectal  deferred Extremities  No bilateral cyanosis, clubbing or edema.   Thereasa Solo, MD  06/24/2019, 2:24 PM

## 2019-07-01 ENCOUNTER — Telehealth: Payer: Self-pay

## 2019-07-01 NOTE — Telephone Encounter (Signed)
Spoke with patient this am. Informed her that Joylene John, NP has completed her [employer] and [employer] forms and will be faxing them to the companies. PT verbalized understanding

## 2019-09-30 ENCOUNTER — Other Ambulatory Visit: Payer: Self-pay | Admitting: Family Medicine

## 2019-09-30 DIAGNOSIS — Z1231 Encounter for screening mammogram for malignant neoplasm of breast: Secondary | ICD-10-CM

## 2019-10-28 DIAGNOSIS — M5430 Sciatica, unspecified side: Secondary | ICD-10-CM | POA: Diagnosis not present

## 2019-10-28 DIAGNOSIS — R202 Paresthesia of skin: Secondary | ICD-10-CM | POA: Diagnosis not present

## 2019-11-23 ENCOUNTER — Ambulatory Visit: Payer: BC Managed Care – PPO

## 2019-12-28 ENCOUNTER — Ambulatory Visit: Payer: BC Managed Care – PPO

## 2020-01-31 ENCOUNTER — Ambulatory Visit: Payer: BC Managed Care – PPO

## 2020-02-08 ENCOUNTER — Ambulatory Visit: Payer: BC Managed Care – PPO

## 2020-03-12 ENCOUNTER — Ambulatory Visit
Admission: RE | Admit: 2020-03-12 | Discharge: 2020-03-12 | Disposition: A | Discharge status: Home / Self Care | Payer: BC Managed Care – PPO | Source: Ambulatory Visit | Attending: Family Medicine | Admitting: Family Medicine

## 2020-03-12 ENCOUNTER — Other Ambulatory Visit: Payer: Self-pay

## 2020-03-12 DIAGNOSIS — Z1231 Encounter for screening mammogram for malignant neoplasm of breast: Secondary | ICD-10-CM

## 2020-04-17 DIAGNOSIS — Z20822 Contact with and (suspected) exposure to covid-19: Secondary | ICD-10-CM | POA: Diagnosis not present

## 2020-06-19 DIAGNOSIS — Z0000 Encounter for general adult medical examination without abnormal findings: Secondary | ICD-10-CM | POA: Diagnosis not present

## 2020-06-19 DIAGNOSIS — M25549 Pain in joints of unspecified hand: Secondary | ICD-10-CM | POA: Diagnosis not present

## 2020-06-19 DIAGNOSIS — R700 Elevated erythrocyte sedimentation rate: Secondary | ICD-10-CM | POA: Diagnosis not present

## 2020-06-19 DIAGNOSIS — E049 Nontoxic goiter, unspecified: Secondary | ICD-10-CM | POA: Diagnosis not present

## 2020-07-19 DIAGNOSIS — M533 Sacrococcygeal disorders, not elsewhere classified: Secondary | ICD-10-CM | POA: Diagnosis not present

## 2020-07-19 DIAGNOSIS — M0609 Rheumatoid arthritis without rheumatoid factor, multiple sites: Secondary | ICD-10-CM | POA: Diagnosis not present

## 2020-07-19 DIAGNOSIS — M79673 Pain in unspecified foot: Secondary | ICD-10-CM | POA: Diagnosis not present

## 2020-07-19 DIAGNOSIS — M79642 Pain in left hand: Secondary | ICD-10-CM | POA: Diagnosis not present

## 2020-07-19 DIAGNOSIS — Z79899 Other long term (current) drug therapy: Secondary | ICD-10-CM | POA: Diagnosis not present

## 2020-07-19 DIAGNOSIS — M79672 Pain in left foot: Secondary | ICD-10-CM | POA: Diagnosis not present

## 2020-07-19 DIAGNOSIS — M25561 Pain in right knee: Secondary | ICD-10-CM | POA: Diagnosis not present

## 2020-07-19 DIAGNOSIS — M25562 Pain in left knee: Secondary | ICD-10-CM | POA: Diagnosis not present

## 2020-07-19 DIAGNOSIS — M79641 Pain in right hand: Secondary | ICD-10-CM | POA: Diagnosis not present

## 2020-07-19 DIAGNOSIS — M25569 Pain in unspecified knee: Secondary | ICD-10-CM | POA: Diagnosis not present

## 2020-07-19 DIAGNOSIS — M1990 Unspecified osteoarthritis, unspecified site: Secondary | ICD-10-CM | POA: Diagnosis not present

## 2020-07-19 DIAGNOSIS — R700 Elevated erythrocyte sedimentation rate: Secondary | ICD-10-CM | POA: Diagnosis not present

## 2020-07-19 DIAGNOSIS — M545 Low back pain: Secondary | ICD-10-CM | POA: Diagnosis not present

## 2020-07-19 DIAGNOSIS — M549 Dorsalgia, unspecified: Secondary | ICD-10-CM | POA: Diagnosis not present

## 2020-07-19 DIAGNOSIS — M79671 Pain in right foot: Secondary | ICD-10-CM | POA: Diagnosis not present

## 2020-07-19 DIAGNOSIS — M2550 Pain in unspecified joint: Secondary | ICD-10-CM | POA: Diagnosis not present

## 2020-08-09 DIAGNOSIS — M1990 Unspecified osteoarthritis, unspecified site: Secondary | ICD-10-CM | POA: Diagnosis not present

## 2020-08-09 DIAGNOSIS — M25569 Pain in unspecified knee: Secondary | ICD-10-CM | POA: Diagnosis not present

## 2020-08-09 DIAGNOSIS — M2550 Pain in unspecified joint: Secondary | ICD-10-CM | POA: Diagnosis not present

## 2020-08-09 DIAGNOSIS — R700 Elevated erythrocyte sedimentation rate: Secondary | ICD-10-CM | POA: Diagnosis not present

## 2020-08-20 ENCOUNTER — Other Ambulatory Visit: Payer: Self-pay

## 2020-08-20 ENCOUNTER — Ambulatory Visit (INDEPENDENT_AMBULATORY_CARE_PROVIDER_SITE_OTHER): Payer: BC Managed Care – PPO | Admitting: Infectious Diseases

## 2020-08-20 ENCOUNTER — Encounter: Payer: Self-pay | Admitting: Infectious Diseases

## 2020-08-20 VITALS — BP 134/86 | HR 75 | Temp 98.0°F

## 2020-08-20 DIAGNOSIS — R7612 Nonspecific reaction to cell mediated immunity measurement of gamma interferon antigen response without active tuberculosis: Secondary | ICD-10-CM | POA: Diagnosis not present

## 2020-08-20 NOTE — Progress Notes (Signed)
[facility]                                      [address], [place], [place], [address]                                               Phn. [phone]; Fax: [phone]                                                               Date: 08/20/2020 Reason for Visit: Positive Quantiferon Test    HPI: BOBETTE LEYH is a 49 y.o.old female with PMH of Asthma, Inflammatory Polyarthritis/ sero negative RA who is referred by her Rheumatologist Dr Kathlene November in the setting of positive Quantiferon test. She is planned to be started on methotrexate and HCQ for her arthritis.   Patient says she was originally born in [place] and moved to the [place] in 1990s. She was tested positive by TB skin test when she arrived to the [place] but had a negative Chest xray. She however has never been treated for it. 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 She denies any known contact with TB. However, says her grandmother had TB.  Denies cough more than 2 weeks, night sweats, SOB, loss of appetite and loss of weight. Risk factor for reactivation - plan to use Methotrexate soon. She was tested for HIV several years ago and was negative. She lives with her son and daughter in [place]. Denies smoking, alcohol and using drugs.   ROS: Denies nausea, vomiting, diarrhea, constipation, recent hospitalizations, rashes, joint complaints, back pain, shortness of breath, chest pain, headaches, dysuria .    Current Outpatient Medications on File Prior to Visit  Medication Sig Dispense Refill  . albuterol (PROVENTIL) (2.5 MG/3ML) 0.083% nebulizer solution Take 2.5 mg by nebulization every 4 (four) hours as needed for wheezing or shortness of breath.     Marland Kitchen ibuprofen (ADVIL) 600 MG tablet Take 1 tablet (600 mg total) by mouth every 6 (six) hours as needed for moderate pain. For AFTER surgery 30 tablet 0  .  senna-docusate (SENOKOT-S) 8.6-50 MG tablet Take 2 tablets by mouth at bedtime. For AFTER surgery, do not take if having diarrhea 30 tablet 0  . oxyCODONE (OXY IR/ROXICODONE) 5 MG immediate release tablet Take 1 tablet (5 mg total) by mouth every 4 (four) hours as needed for severe pain. For AFTER surgery, do not take and drive (Patient not taking: Reported on 08/20/2020) 10 tablet 0   No current facility-administered medications on file prior to visit.     Allergies  Allergen Reactions  . Shrimp [Shellfish Allergy] Anaphylaxis and Swelling  . Betadine [Povidone Iodine] Rash    Leaves rash on body   Past Medical History:  Diagnosis Date  . Eczema   . Endometriosis   . Hypertension    last 2 weeks of pregnancy - 3 years ago   . Seasonal allergies   . Thyroid goiter  bipsy done here 2013   Past Surgical History:  Procedure Laterality Date  . CESAREAN SECTION  2003/2012  . IRRIGATION AND DEBRIDEMENT ABSCESS Right 01/20/2014   Procedure: EXCISION RIGHT LOWER ABDOMINAL WALL MASS;  Surgeon: Merrie Roof, MD;  Location: [facility];  Service: General;  Laterality: Right;  . ROBOTIC ASSISTED TOTAL HYSTERECTOMY N/A 05/31/2019   Procedure: XI ROBOTIC ASSISTED TOTAL HYSTERECTOMY UTERUS GREATER THAN 250 GRAMS;  Surgeon: Everitt Amber, MD;  Location: [facility];  Service: Gynecology;  Laterality: N/A;  . XI ROBOTIC ASSISTED SALPINGECTOMY Bilateral 05/31/2019   Procedure: XI ROBOTIC ASSISTED SALPINGECTOMY AND MINI LAPAROTOMY;  Surgeon: Everitt Amber, MD;  Location: [facility];  Service: Gynecology;  Laterality: Bilateral;   Social History   Socioeconomic History  . Marital status: Married    Spouse name: Not on file  . Number of children: Not on file  . Years of education: Not on file  . Highest education level: Not on file  Occupational History  . Not on file  Tobacco Use  . Smoking status: Never Smoker  . Smokeless tobacco: Never Used  Vaping Use  . Vaping Use: Never used  Substance and Sexual  Activity  . Alcohol use: No  . Drug use: No  . Sexual activity: Yes  Other Topics Concern  . Not on file  Social History Narrative  . Not on file   Social Determinants of Health   Financial Resource Strain:   . Difficulty of Paying Living Expenses: Not on file  Food Insecurity:   . Worried About Charity fundraiser in the Last Year: Not on file  . Ran Out of Food in the Last Year: Not on file  Transportation Needs:   . Lack of Transportation (Medical): Not on file  . Lack of Transportation (Non-Medical): Not on file  Physical Activity:   . Days of Exercise per Week: Not on file  . Minutes of Exercise per Session: Not on file  Stress:   . Feeling of Stress : Not on file  Social Connections:   . Frequency of Communication with Friends and Family: Not on file  . Frequency of Social Gatherings with Friends and Family: Not on file  . Attends Religious Services: Not on file  . Active Member of Clubs or Organizations: Not on file  . Attends Archivist Meetings: Not on file  . Marital Status: Not on file  Intimate Partner Violence:   . Fear of Current or Ex-Partner: Not on file  . Emotionally Abused: Not on file  . Physically Abused: Not on file  . Sexually Abused: Not on file     Physical exam: BP 134/86   Pulse 75   Temp 98 F (36.7 C)   Gen: Alert and oriented x 3, no acute distress HEENT: [place]/AT, PERL,  no scleral icterus, no pale conjunctivae, hearing normal, oral mucosa moist Neck: Supple, no lymphadenopathy Cardio: Regular rate and rhythm; +S1 and S2; no murmurs, gallops, or rubs Resp: CTAB; no wheezes, rhonchi, or rales GI: Soft, nontender, nondistended, bowel sounds present Extremities: No cyanosis, clubbing, or edema; +2 PT and DP pulses Skin: No rashes, lesions, or ecchymoses Neuro: No focal deficits Psych: Calm, cooperative  Laboratory  07/19/20 HBSAg negative, Hep B Core Ab total negative, hep B surface ab reactive, Hep C virus ab <0.1 9/2/1  Quantiferon test Positive   Assessment/Plan: 1. Positive Quantiferon Discussed about Quantiferon test, latent TB and active TB at length Discussed about different treatment options  for treatment of latent TB including its side effects. She has a h/o Carpal tunnnel syndrome. Hence, will try to avoid regimen including isoniazid    Follow up in 4 weeks for latent TB Treatment if chest xray negative  Orders Placed This Encounter  Procedures  . DG Chest 2 View  . HIV Antibody (routine testing w rflx)  . Hepatic function panel    Immunization  Says has received COVID vaccine  I spent greater than 60 minutes with the patient including greater than 50% of time in face to face counsel of the patient and in coordination of their care.   Patient's labs were reviewed as well as his previous records. Patients questions were addressed and answered.   Rosiland Oz, MD Infectious Diseases  Office phone [phone] Fax no. [phone]

## 2020-08-20 NOTE — Assessment & Plan Note (Signed)
Chest Xray HIV antibody Liver function panel

## 2020-08-21 LAB — HEPATIC FUNCTION PANEL
AG Ratio: 1.1 (calc) (ref 1.0–2.5)
ALT: 14 U/L (ref 6–29)
AST: 18 U/L (ref 10–35)
Albumin: 4.2 g/dL (ref 3.6–5.1)
Alkaline phosphatase (APISO): 46 U/L (ref 31–125)
Bilirubin, Direct: 0.2 mg/dL (ref 0.0–0.2)
Globulin: 3.9 g/dL (calc) — ABNORMAL HIGH (ref 1.9–3.7)
Indirect Bilirubin: 0.2 mg/dL (calc) (ref 0.2–1.2)
Total Bilirubin: 0.4 mg/dL (ref 0.2–1.2)
Total Protein: 8.1 g/dL (ref 6.1–8.1)

## 2020-08-21 LAB — HIV ANTIBODY (ROUTINE TESTING W REFLEX): HIV 1&2 Ab, 4th Generation: NONREACTIVE

## 2020-08-28 ENCOUNTER — Ambulatory Visit
Admission: RE | Admit: 2020-08-28 | Discharge: 2020-08-28 | Disposition: A | Discharge status: Home / Self Care | Payer: BC Managed Care – PPO | Source: Ambulatory Visit | Attending: Infectious Diseases | Admitting: Infectious Diseases

## 2020-08-28 ENCOUNTER — Encounter: Payer: Self-pay | Admitting: Radiology

## 2020-08-28 DIAGNOSIS — R7611 Nonspecific reaction to tuberculin skin test without active tuberculosis: Secondary | ICD-10-CM | POA: Diagnosis not present

## 2020-09-19 NOTE — Progress Notes (Signed)
Chest Xray negative. Will need a fu with you for latent TB treatment

## 2020-09-19 NOTE — Progress Notes (Signed)
Sounds good. I will reach out to her.

## 2020-09-25 ENCOUNTER — Ambulatory Visit (INDEPENDENT_AMBULATORY_CARE_PROVIDER_SITE_OTHER): Payer: BC Managed Care – PPO | Admitting: Pharmacist

## 2020-09-25 ENCOUNTER — Other Ambulatory Visit: Payer: Self-pay

## 2020-09-25 DIAGNOSIS — R7612 Nonspecific reaction to cell mediated immunity measurement of gamma interferon antigen response without active tuberculosis: Secondary | ICD-10-CM | POA: Diagnosis not present

## 2020-09-25 MED ORDER — RIFAMPIN 300 MG PO CAPS: 600.0000 mg | ORAL_CAPSULE | Freq: Every day | ORAL | 3 refills | Status: DC

## 2020-09-25 NOTE — Progress Notes (Signed)
HPI: Alexis Fleming is a 49 y.o. female who presents to the [place] [facility] for latent TB follow-up.  Patient Active Problem List   Diagnosis Date Noted  . Positive QuantiFERON-TB Gold test 08/20/2020  . Pelvic mass 04/08/2019  . Intramural and subserous leiomyoma of uterus 04/08/2019  . Abdominal mass, RUQ (right upper quadrant) 12/28/2013    Patient's Medications  New Prescriptions   No medications on file  Previous Medications   ALBUTEROL (PROVENTIL) (2.5 MG/3ML) 0.083% NEBULIZER SOLUTION    Take 2.5 mg by nebulization every 4 (four) hours as needed for wheezing or shortness of breath.    IBUPROFEN (ADVIL) 600 MG TABLET    Take 1 tablet (600 mg total) by mouth every 6 (six) hours as needed for moderate pain. For AFTER surgery   OXYCODONE (OXY IR/ROXICODONE) 5 MG IMMEDIATE RELEASE TABLET    Take 1 tablet (5 mg total) by mouth every 4 (four) hours as needed for severe pain. For AFTER surgery, do not take and drive   SENNA-DOCUSATE (SENOKOT-S) 8.6-50 MG TABLET    Take 2 tablets by mouth at bedtime. For AFTER surgery, do not take if having diarrhea  Modified Medications   No medications on file  Discontinued Medications   No medications on file    Allergies: Allergies  Allergen Reactions  . Shrimp [Shellfish Allergy] Anaphylaxis and Swelling  . Betadine [Povidone Iodine] Rash    Leaves rash on body    Past Medical History: Past Medical History:  Diagnosis Date  . Eczema   . Endometriosis   . Hypertension    last 2 weeks of pregnancy - 3 years ago   . Seasonal allergies   . Thyroid goiter    bipsy done here 2013    Social History: Social History   Socioeconomic History  . Marital status: Married    Spouse name: Not on file  . Number of children: Not on file  . Years of education: Not on file  . Highest education level: Not on file  Occupational History  . Not on file  Tobacco Use  . Smoking status: Never Smoker  . Smokeless tobacco: Never Used    Vaping Use  . Vaping Use: Never used  Substance and Sexual Activity  . Alcohol use: No  . Drug use: No  . Sexual activity: Yes  Other Topics Concern  . Not on file  Social History Narrative  . Not on file   Social Determinants of Health   Financial Resource Strain:   . Difficulty of Paying Living Expenses: Not on file  Food Insecurity:   . Worried About Charity fundraiser in the Last Year: Not on file  . Ran Out of Food in the Last Year: Not on file  Transportation Needs:   . Lack of Transportation (Medical): Not on file  . Lack of Transportation (Non-Medical): Not on file  Physical Activity:   . Days of Exercise per Week: Not on file  . Minutes of Exercise per Session: Not on file  Stress:   . Feeling of Stress : Not on file  Social Connections:   . Frequency of Communication with Friends and Family: Not on file  . Frequency of Social Gatherings with Friends and Family: Not on file  . Attends Religious Services: Not on file  . Active Member of Clubs or Organizations: Not on file  . Attends Archivist Meetings: Not on file  . Marital Status: Not on file  Labs: No results found for: HIV1RNAQUANT, HIV1RNAVL, CD4TABS  RPR and STI Lab Results  Component Value Date   LABRPR NON REACTIVE 11/25/2010    No flowsheet data found.  Hepatitis B No results found for: HEPBSAB, HEPBSAG, HEPBCAB Hepatitis C No results found for: HEPCAB, HCVRNAPCRQN Hepatitis A No results found for: HAV Lipids: No results found for: CHOL, TRIG, HDL, CHOLHDL, VLDL, LDLCALC  Assessment: Alexis Fleming presents to clinic today for follow-up on starting latent TB treatment. She saw Dr. West Bali last month given her positive quantiferon test and negative chest X-ray. She was born in the [place] and moved to the [place] in the 1990s. Her TST was positive upon moving to the [place] but had a negative CXR. Dr. West Bali plans to start latent TB treatment given Alexis Fleming's newly diagnosed rheumatoid  arthritis and likelihood of starting immunosuppressive agents such as methotrexate and hydroxychloroquine. I also explained to the patient that she has a 5% lifetime risk of converting to active TB.  Dr. West Bali wanted to avoid isoniazid therapy given Alexis Fleming's  current paresthesias in her arms. Her PCP believes she has carpal tunnel, and she has been wearing a splint with relief. An alternative for latent TB is rifampin 600mg  once daily for 4 months. Discussed side effects such as nausea and secretion discoloration with the patient. Reviewed her medication list, and the only potential drug interaction is with lowering hydroxychloroquine concentrations. She has not started hydroxychloroquine at present and wanted to wait until she completed her latent TB treatment. She stated she has difficulty taking medications everyday, so I provided her pillboxes and told her to set alarms everyday to remember the rifampin.  Will send in her 58-month rifampin prescription to [facility] [place] today. She will follow-up with Dr. West Bali in 1 month.    Plan: Send in rifampin 600mg  once daily 72-month prescription to [facility] in [place], [place] Follow-up with Dr. West Bali on 12/13 @ 1:45PM  Alfonse Spruce, PharmD PGY2 ID Pharmacy Resident [phone]   09/25/2020, 1:36 PM

## 2020-10-29 ENCOUNTER — Ambulatory Visit: Payer: BC Managed Care – PPO | Admitting: Infectious Diseases

## 2020-10-30 ENCOUNTER — Encounter: Payer: Self-pay | Admitting: Infectious Diseases

## 2020-10-30 ENCOUNTER — Ambulatory Visit: Payer: BC Managed Care – PPO | Admitting: Infectious Diseases

## 2020-10-30 ENCOUNTER — Other Ambulatory Visit: Payer: Self-pay

## 2020-10-30 VITALS — BP 135/84 | HR 88 | Temp 97.9°F | Wt 151.8 lb

## 2020-10-30 DIAGNOSIS — E559 Vitamin D deficiency, unspecified: Secondary | ICD-10-CM

## 2020-10-30 DIAGNOSIS — Z5181 Encounter for therapeutic drug level monitoring: Secondary | ICD-10-CM

## 2020-10-30 DIAGNOSIS — R7612 Nonspecific reaction to cell mediated immunity measurement of gamma interferon antigen response without active tuberculosis: Secondary | ICD-10-CM

## 2020-10-30 NOTE — Progress Notes (Signed)
[facility]                                                             [place], [place], [place], [address]                                                                  Phn. [phone]; Fax: [phone]                                                                             Date: 10/30/2020  Reason for Follow Up Visit - Latent TB treatment   Assessment Alexis Fleming is a 49 y.o.old female with PMH of Asthma, Inflammatory Polyarthritis/ sero negative RA who is referred by her Rheumatologist Dr Kathlene November in the setting of positive Quantiferon test. She is planned to be started on methotrexate and HCQ for her arthritis. She was started on Rifampin 600mg  PO Daily approx a month ago.  She is tolerating Rifampin well with no concerning side effects except metallic taste. She will be following up with Rheum in next couple of weeks. From ID stanpoint, it is OK to start antiinflammatory as clinically indicated for her arthritis after completing 4-6 weeks of latent TB treatment  Following labs today Orders Placed This Encounter  Procedures  . Comprehensive metabolic panel  . CBC  . Vitamin D (25 hydroxy)   Follow up in a month  All questions and concerns were discussed and addressed. Patient verbalized understanding of the plan. ____________________________________________________________________________________________________________________  Subjective   Alexis Fleming is a 50 y.o.old female with PMH of Asthma, Inflammatory Polyarthritis/ sero negative RA who is referred by her Rheumatologist Dr Kathlene November in the setting of positive Quantiferon test. She is planned to be started on methotrexate and HCQ for her arthritis. She was started on Rifampin 600mg  PO Daily approx a month ago ( possibly 09/27/20 as she says she finished her first bottle on 10/27/20). She takes it everyday at  10-11am 1 hra fter breakfast and 2 hours after lunch. She denies missing any doses. Says she has a metallic taste since she started Rifampin. No new medications.  Her urine color is orange as expected. Denies any nausea/vomiting/abdominal pain and diarrhea. Appetite is good.   She is going to follow up with her Rheumatologist in next couple of weeks for starting MTX/HCQ. However, she says to me she does not want to start those treatment. She says that she has been working too much last year and probably that is why all her joints hurt. I told her to discuss it with her Rheumatologist.   ROS:  11 point ROS negative except as above  Past Medical History:  Diagnosis Date  . Eczema   . Endometriosis   .  Hypertension    last 2 weeks of pregnancy - 3 years ago   . Seasonal allergies   . Thyroid goiter    bipsy done here 2013    Past Surgical History:  Procedure Laterality Date  . CESAREAN SECTION  2003/2012  . IRRIGATION AND DEBRIDEMENT ABSCESS Right 01/20/2014   Procedure: EXCISION RIGHT LOWER ABDOMINAL WALL MASS;  Surgeon: Merrie Roof, MD;  Location: [facility];  Service: General;  Laterality: Right;  . ROBOTIC ASSISTED TOTAL HYSTERECTOMY N/A 05/31/2019   Procedure: XI ROBOTIC ASSISTED TOTAL HYSTERECTOMY UTERUS GREATER THAN 250 GRAMS;  Surgeon: Everitt Amber, MD;  Location: [facility];  Service: Gynecology;  Laterality: N/A;  . XI ROBOTIC ASSISTED SALPINGECTOMY Bilateral 05/31/2019   Procedure: XI ROBOTIC ASSISTED SALPINGECTOMY AND MINI LAPAROTOMY;  Surgeon: Everitt Amber, MD;  Location: [facility];  Service: Gynecology;  Laterality: Bilateral;   Current Outpatient Medications on File Prior to Visit  Medication Sig Dispense Refill  . albuterol (PROVENTIL) (2.5 MG/3ML) 0.083% nebulizer solution Take 2.5 mg by nebulization every 4 (four) hours as needed for wheezing or shortness of breath.     Marland Kitchen ibuprofen (ADVIL) 600 MG tablet Take 1 tablet (600 mg total) by mouth every 6 (six) hours as needed for moderate  pain. For AFTER surgery 30 tablet 0  . rifampin (RIFADIN) 300 MG capsule Take 2 capsules (600 mg total) by mouth daily. 60 capsule 3  . senna-docusate (SENOKOT-S) 8.6-50 MG tablet Take 2 tablets by mouth at bedtime. For AFTER surgery, do not take if having diarrhea 30 tablet 0  . oxyCODONE (OXY IR/ROXICODONE) 5 MG immediate release tablet Take 1 tablet (5 mg total) by mouth every 4 (four) hours as needed for severe pain. For AFTER surgery, do not take and drive (Patient not taking: No sig reported) 10 tablet 0   No current facility-administered medications on file prior to visit.   Allergies  Allergen Reactions  . Shrimp [Shellfish Allergy] Anaphylaxis and Swelling  . Betadine [Povidone Iodine] Rash    Leaves rash on body   Social History   Socioeconomic History  . Marital status: Married    Spouse name: Not on file  . Number of children: Not on file  . Years of education: Not on file  . Highest education level: Not on file  Occupational History  . Not on file  Tobacco Use  . Smoking status: Never Smoker  . Smokeless tobacco: Never Used  Vaping Use  . Vaping Use: Never used  Substance and Sexual Activity  . Alcohol use: No  . Drug use: No  . Sexual activity: Yes  Other Topics Concern  . Not on file  Social History Narrative  . Not on file   Social Determinants of Health   Financial Resource Strain: Not on file  Food Insecurity: Not on file  Transportation Needs: Not on file  Physical Activity: Not on file  Stress: Not on file  Social Connections: Not on file  Intimate Partner Violence: Not on file     Vitals BP 135/84   Pulse 88   Temp 97.9 F (36.6 C) (Oral)   Wt 151 lb 12.8 oz (68.9 kg)   LMP 05/07/2019 (Approximate)   BMI 26.06 kg/m    Examination  General - not in acute distress, comfortably sitting in chair HEENT - PEERLA, no pallor and NO ICTERUS Chest - b/l clear air entry, no additional sounds CVS- Normal s1s2, RRR Abdomen - Soft, Non tender ,  non distended Ext-  no pedal edema Neuro: grossly normal Back - WNL Psych : calm and cooperative   Recent labs CBC Latest Ref Rng & Units 05/27/2019 01/20/2014 12/03/2013  WBC 4.0 - 10.5 K/uL 7.3 6.7 8.8  Hemoglobin 12.0 - 15.0 g/dL 11.1(L) 11.5(L) 11.3(L)  Hematocrit 36.0 - 46.0 % 36.4 34.7(L) 34.6(L)  Platelets 150 - 400 K/uL 252 258 267   CMP Latest Ref Rng & Units 08/20/2020 05/27/2019 12/03/2013  Glucose 70 - 99 mg/dL - 96 112(H)  BUN 6 - 20 mg/dL - 10 13  Creatinine 0.44 - 1.00 mg/dL - 0.61 0.56  Sodium 135 - 145 mmol/L - 136 137  Potassium 3.5 - 5.1 mmol/L - 3.6 3.7  Chloride 98 - 111 mmol/L - 105 99  CO2 22 - 32 mmol/L - 24 26  Calcium 8.9 - 10.3 mg/dL - 8.8(L) 9.0  Total Protein 6.1 - 8.1 g/dL 8.1 9.2(H) 8.5(H)  Total Bilirubin 0.2 - 1.2 mg/dL 0.4 0.7 0.2(L)  Alkaline Phos 38 - 126 U/L - 47 57  AST 10 - 35 U/L 18 26 19   ALT 6 - 29 U/L 14 19 14      Pertinent Microbiology Results for orders placed or performed during the hospital encounter of 11/27/10  Surgical pcr screen     Status: None   Collection Time: 11/25/10  2:45 PM  Result Value Ref Range Status   MRSA, PCR NEGATIVE NEGATIVE Final   Staphylococcus aureus  NEGATIVE Final    NEGATIVE        The Xpert SA Assay (FDA approved for NASAL specimens only), is one component of a comprehensive surveillance program.  It is not intended to diagnose infection nor to guide or monitor treatment.     All pertinent labs/Imagings/notes reviewed. All pertinent plain films and CT images have been personally visualized and interpreted; radiology reports have been reviewed. Decision making incorporated into the Impression / Recommendations.  I spent greater than 25 minutes with the patient including  review of prior medical records with greater than 50% of time in face to face counsel of the patient.    Electronically signed by:  Rosiland Oz, MD Infectious Disease Physician [facility] Infect[facility]  [facility] [address] [place], [place] [address] Phone: [phone]  Fax: [phone]

## 2020-10-30 NOTE — Assessment & Plan Note (Signed)
CMP today CBC and Vitamin D level per patients request

## 2020-10-30 NOTE — Assessment & Plan Note (Addendum)
Continue Rifampin 600mg  PO daily. Expected end date for 4 months of therapy from start date on 09/27/20  Fu in a month

## 2020-10-31 LAB — COMPREHENSIVE METABOLIC PANEL
AG Ratio: 1 (calc) (ref 1.0–2.5)
ALT: 14 U/L (ref 6–29)
AST: 21 U/L (ref 10–35)
Albumin: 4.1 g/dL (ref 3.6–5.1)
Alkaline phosphatase (APISO): 58 U/L (ref 31–125)
BUN: 12 mg/dL (ref 7–25)
CO2: 29 mmol/L (ref 20–32)
Calcium: 9 mg/dL (ref 8.6–10.2)
Chloride: 104 mmol/L (ref 98–110)
Creat: 0.55 mg/dL (ref 0.50–1.10)
Globulin: 4 g/dL (calc) — ABNORMAL HIGH (ref 1.9–3.7)
Glucose, Bld: 85 mg/dL (ref 65–99)
Potassium: 4 mmol/L (ref 3.5–5.3)
Sodium: 138 mmol/L (ref 135–146)
Total Bilirubin: 0.2 mg/dL (ref 0.2–1.2)
Total Protein: 8.1 g/dL (ref 6.1–8.1)

## 2020-10-31 LAB — CBC
HCT: 39 % (ref 35.0–45.0)
Hemoglobin: 12.5 g/dL (ref 11.7–15.5)
MCH: 27.1 pg (ref 27.0–33.0)
MCHC: 32.1 g/dL (ref 32.0–36.0)
MCV: 84.4 fL (ref 80.0–100.0)
MPV: 10.4 fL (ref 7.5–12.5)
Platelets: 230 10*3/uL (ref 140–400)
RBC: 4.62 10*6/uL (ref 3.80–5.10)
RDW: 13.3 % (ref 11.0–15.0)
WBC: 5.4 10*3/uL (ref 3.8–10.8)

## 2020-10-31 LAB — VITAMIN D 25 HYDROXY (VIT D DEFICIENCY, FRACTURES): Vit D, 25-Hydroxy: 39 ng/mL (ref 30–100)

## 2020-11-01 ENCOUNTER — Telehealth: Payer: Self-pay

## 2020-11-01 NOTE — Telephone Encounter (Signed)
-----   Message from Rosiland Oz, MD sent at 10/31/2020  8:09 AM EST ----- Let her know that her labs from yesterday was normal. Her Vitamin D levels are also normal

## 2020-11-01 NOTE — Telephone Encounter (Signed)
Called patient regarding lab results from earlier this week. Spoke with patient and relayed message from provider. Does not have any questions at this time. Will call office if any concerns arise.  [place]

## 2020-11-28 ENCOUNTER — Ambulatory Visit: Payer: BC Managed Care – PPO | Admitting: Infectious Diseases

## 2020-12-04 ENCOUNTER — Encounter: Payer: Self-pay | Admitting: Infectious Diseases

## 2020-12-04 ENCOUNTER — Ambulatory Visit (INDEPENDENT_AMBULATORY_CARE_PROVIDER_SITE_OTHER): Payer: BC Managed Care – PPO | Admitting: Infectious Diseases

## 2020-12-04 ENCOUNTER — Other Ambulatory Visit: Payer: Self-pay

## 2020-12-04 VITALS — BP 132/83 | HR 81 | Temp 98.0°F | Ht 64.0 in | Wt 152.0 lb

## 2020-12-04 DIAGNOSIS — R7612 Nonspecific reaction to cell mediated immunity measurement of gamma interferon antigen response without active tuberculosis: Secondary | ICD-10-CM

## 2020-12-04 DIAGNOSIS — Z5181 Encounter for therapeutic drug level monitoring: Secondary | ICD-10-CM | POA: Diagnosis not present

## 2020-12-04 LAB — COMPREHENSIVE METABOLIC PANEL
AG Ratio: 1.2 (calc) (ref 1.0–2.5)
ALT: 20 U/L (ref 6–29)
AST: 22 U/L (ref 10–35)
Albumin: 4.4 g/dL (ref 3.6–5.1)
Alkaline phosphatase (APISO): 54 U/L (ref 31–125)
BUN: 10 mg/dL (ref 7–25)
CO2: 26 mmol/L (ref 20–32)
Calcium: 8.9 mg/dL (ref 8.6–10.2)
Chloride: 102 mmol/L (ref 98–110)
Creat: 0.55 mg/dL (ref 0.50–1.10)
Globulin: 3.8 g/dL (calc) — ABNORMAL HIGH (ref 1.9–3.7)
Glucose, Bld: 89 mg/dL (ref 65–99)
Potassium: 3.8 mmol/L (ref 3.5–5.3)
Sodium: 134 mmol/L — ABNORMAL LOW (ref 135–146)
Total Bilirubin: 0.3 mg/dL (ref 0.2–1.2)
Total Protein: 8.2 g/dL — ABNORMAL HIGH (ref 6.1–8.1)

## 2020-12-04 NOTE — Assessment & Plan Note (Signed)
Continue Rifampin as is  End date 01/25/2021 Fu in 1 month

## 2020-12-04 NOTE — Assessment & Plan Note (Signed)
CMP today

## 2020-12-04 NOTE — Progress Notes (Signed)
[facility] Infectious [facility]                                                             [place], [place], [place], [address]                                                                  Phn. [phone]; Fax: [phone]                                                                             Date: 12/04/2020 Reason for Follow Up: Latent TB treatment   Assessment  Alexis Fleming a 50 y.o.oldfemalewith PMHofAsthma, Inflammatory Polyarthritis/sero negative RA who is being followed up for latent TB. She started taking Rifampin on 09/27/20.  She is tolerating Rifampin well with no concerning side effects. Metallic taste is gone.   Plan Continue Rifampin 600mg  PO daily. Plan to continue for 4 months. End date of PO Rifampin is March 09/2021 CMP today  Fu in 1 month  All questions and concerns were discussed and addressed. Patient verbalized understanding of the plan. ____________________________________________________________________________________________________________________ Subjective/Interval Events  10/30/20  Alexis Fleming a 50 y.o.oldfemalewith PMHofAsthma, Inflammatory Polyarthritis/sero negative RA who is referred by her RheumatologistDr Aryalin the setting of positive Quantiferon test. She is planned to be started on methotrexate and HCQ for her arthritis. She was started on Rifampin 600mg  PO Daily approx a month ago ( possibly 09/27/20 as she says she finished her first bottle on 10/27/20). She takes it everyday at 10-11am 1 hra fter breakfast and 2 hours after lunch. She denies missing any doses. Says she has a metallic taste since she started Rifampin. No new medications.  Her urine color is orange as expected. Denies any nausea/vomiting/abdominal pain and diarrhea. Appetite is good.   She is going to follow up with her Rheumatologist in next couple of weeks  for starting MTX/HCQ. However, she says to me she does not want to start those treatment. She says that she has been working too much last year and probably that is why all her joints hurt. I told her to discuss it with her Rheumatologist.   12/04/20 Taking Rifampin everyday, no missed doses. Denies side effects with Rifampin. She is taking Rifampin in between her breakfast and lunch. No new medications. Denies being on oral contraceptive pills. Discussed with the DDIs of Rifampin and need to inform me if new medications are considered.   Denies nausea/vomiting/abdominal pain and diarrhea. Metallic taste is gone. Appetite is good, no changes in weight.   She says she is not interested to start treatment on biologics for her arthritis. She says her joint pains are better compare dto before. I told her to discuss that with her Rheumatologist which she is  following up soon  ROS: 11 point ROS done with pertinent positives and negatives listed above. Rest of ROS is negative  Past Medical History:  Diagnosis Date  . Eczema   . Endometriosis   . Hypertension    last 2 weeks of pregnancy - 3 years ago   . Seasonal allergies   . Thyroid goiter    bipsy done here 2013   Past Surgical History:  Procedure Laterality Date  . CESAREAN SECTION  2003/2012  . IRRIGATION AND DEBRIDEMENT ABSCESS Right 01/20/2014   Procedure: EXCISION RIGHT LOWER ABDOMINAL WALL MASS;  Surgeon: Merrie Roof, MD;  Location: [facility];  Service: General;  Laterality: Right;  . ROBOTIC ASSISTED TOTAL HYSTERECTOMY N/A 05/31/2019   Procedure: XI ROBOTIC ASSISTED TOTAL HYSTERECTOMY UTERUS GREATER THAN 250 GRAMS;  Surgeon: Everitt Amber, MD;  Location: [facility];  Service: Gynecology;  Laterality: N/A;  . XI ROBOTIC ASSISTED SALPINGECTOMY Bilateral 05/31/2019   Procedure: XI ROBOTIC ASSISTED SALPINGECTOMY AND MINI LAPAROTOMY;  Surgeon: Everitt Amber, MD;  Location: [facility];  Service: Gynecology;  Laterality: Bilateral;   Current Outpatient  Medications on File Prior to Visit  Medication Sig Dispense Refill  . albuterol (PROVENTIL) (2.5 MG/3ML) 0.083% nebulizer solution Take 2.5 mg by nebulization every 4 (four) hours as needed for wheezing or shortness of breath.     Marland Kitchen ibuprofen (ADVIL) 600 MG tablet Take 1 tablet (600 mg total) by mouth every 6 (six) hours as needed for moderate pain. For AFTER surgery 30 tablet 0  . oxyCODONE (OXY IR/ROXICODONE) 5 MG immediate release tablet Take 1 tablet (5 mg total) by mouth every 4 (four) hours as needed for severe pain. For AFTER surgery, do not take and drive (Patient not taking: No sig reported) 10 tablet 0  . rifampin (RIFADIN) 300 MG capsule Take 2 capsules (600 mg total) by mouth daily. 60 capsule 3  . senna-docusate (SENOKOT-S) 8.6-50 MG tablet Take 2 tablets by mouth at bedtime. For AFTER surgery, do not take if having diarrhea 30 tablet 0   No current facility-administered medications on file prior to visit.   Allergies  Allergen Reactions  . Shrimp [Shellfish Allergy] Anaphylaxis and Swelling  . Betadine [Povidone Iodine] Rash    Leaves rash on body   Social History   Socioeconomic History  . Marital status: Married    Spouse name: Not on file  . Number of children: Not on file  . Years of education: Not on file  . Highest education level: Not on file  Occupational History  . Not on file  Tobacco Use  . Smoking status: Never Smoker  . Smokeless tobacco: Never Used  Vaping Use  . Vaping Use: Never used  Substance and Sexual Activity  . Alcohol use: No  . Drug use: No  . Sexual activity: Yes  Other Topics Concern  . Not on file  Social History Narrative  . Not on file   Social Determinants of Health   Financial Resource Strain: Not on file  Food Insecurity: Not on file  Transportation Needs: Not on file  Physical Activity: Not on file  Stress: Not on file  Social Connections: Not on file  Intimate Partner Violence: Not on file    Vitals  Examination   General - not in acute distress, comfortably sitting in chair HEENT - PEERLA, no pallor and no icterus Chest - b/l clear air entry, no additional sounds CVS- Normal s1s2, RRR Abdomen - Soft, Non tender , non distended  Ext- no pedal edema Neuro: grossly normal Back - WNL Psych : calm and cooperative   Recent labs CBC Latest Ref Rng & Units 10/30/2020 05/27/2019 01/20/2014  WBC 3.8 - 10.8 Thousand/uL 5.4 7.3 6.7  Hemoglobin 11.7 - 15.5 g/dL 12.5 11.1(L) 11.5(L)  Hematocrit 35.0 - 45.0 % 39.0 36.4 34.7(L)  Platelets 140 - 400 Thousand/uL 230 252 258   CMP Latest Ref Rng & Units 10/30/2020 08/20/2020 05/27/2019  Glucose 65 - 99 mg/dL 85 - 96  BUN 7 - 25 mg/dL 12 - 10  Creatinine 0.50 - 1.10 mg/dL 0.55 - 0.61  Sodium 135 - 146 mmol/L 138 - 136  Potassium 3.5 - 5.3 mmol/L 4.0 - 3.6  Chloride 98 - 110 mmol/L 104 - 105  CO2 20 - 32 mmol/L 29 - 24  Calcium 8.6 - 10.2 mg/dL 9.0 - 8.8(L)  Total Protein 6.1 - 8.1 g/dL 8.1 8.1 9.2(H)  Total Bilirubin 0.2 - 1.2 mg/dL 0.2 0.4 0.7  Alkaline Phos 38 - 126 U/L - - 47  AST 10 - 35 U/L 21 18 26   ALT 6 - 29 U/L 14 14 19     Pertinent Microbiology Results for orders placed or performed during the hospital encounter of 11/27/10  Surgical pcr screen     Status: None   Collection Time: 11/25/10  2:45 PM  Result Value Ref Range Status   MRSA, PCR NEGATIVE NEGATIVE Final   Staphylococcus aureus  NEGATIVE Final    NEGATIVE        The Xpert SA Assay (FDA approved for NASAL specimens only), is one component of a comprehensive surveillance program.  It is not intended to diagnose infection nor to guide or monitor treatment.    All pertinent labs/Imagings/notes reviewed. All pertinent plain films and CT images have been personally visualized and interpreted; radiology reports have been reviewed. Decision making incorporated into the Impression / Recommendations.  I spent greater than 25 minutes with the patient including  review of prior medical  records with greater than 50% of time in face to face counsel of the patient.    Electronically signed by:  Rosiland Oz, MD Infectious Disease Physician [facility] for Infect[facility] Disease [address] [place], [place] [address] Phone: [phone]  Fax: [phone]

## 2020-12-19 ENCOUNTER — Telehealth: Payer: Self-pay

## 2020-12-19 NOTE — Telephone Encounter (Signed)
Called to get a one month follow up scheduled, left a voicemail to call us back and get that scheduled.

## 2021-01-11 ENCOUNTER — Other Ambulatory Visit: Payer: Self-pay

## 2021-01-11 DIAGNOSIS — R7612 Nonspecific reaction to cell mediated immunity measurement of gamma interferon antigen response without active tuberculosis: Secondary | ICD-10-CM

## 2021-01-11 MED ORDER — RIFAMPIN 300 MG PO CAPS: 600.0000 mg | ORAL_CAPSULE | Freq: Every day | ORAL | 0 refills | Status: DC

## 2021-01-22 ENCOUNTER — Ambulatory Visit (INDEPENDENT_AMBULATORY_CARE_PROVIDER_SITE_OTHER): Payer: BC Managed Care – PPO | Admitting: Infectious Diseases

## 2021-01-22 ENCOUNTER — Encounter: Payer: Self-pay | Admitting: Infectious Diseases

## 2021-01-22 ENCOUNTER — Other Ambulatory Visit: Payer: Self-pay

## 2021-01-22 VITALS — BP 134/84 | HR 75 | Temp 97.8°F | Ht 64.0 in | Wt 148.0 lb

## 2021-01-22 DIAGNOSIS — R7612 Nonspecific reaction to cell mediated immunity measurement of gamma interferon antigen response without active tuberculosis: Secondary | ICD-10-CM

## 2021-01-22 DIAGNOSIS — Z5181 Encounter for therapeutic drug level monitoring: Secondary | ICD-10-CM

## 2021-01-22 NOTE — Progress Notes (Signed)
[facility]                                                             [place], [place], [place], [address]                                                                  Phn. [phone]; Fax: [phone]                                                                             Date: 01/22/21  Reason for Follow Up: latent TB  Assessment Alexis Fleming a 50 y.o.oldfemalewith PMHofAsthma, Inflammatory Polyarthritis/sero negative RA who is being followed up for latent TB. She started taking Rifampin on 09/27/20.  She is tolerating Rifampin well with no concerning side effects.  Plan Continue Rifampin 600mg  PO daily. Continue Rifampin until March 09/2021, end of 4 month tx course.  CMP today  Fu as needed   All questions and concerns were discussed and addressed. Patient verbalized understanding of the plan. ____________________________________________________________________________________________________________________ Subjective/Interval Events  10/30/20  Alexis Fleming a 50 y.o.oldfemalewith PMHofAsthma, Inflammatory Polyarthritis/sero negative RA who is referred by her RheumatologistDr Aryalin the setting of positive Quantiferon test. She is planned to be started on methotrexate and HCQ for her arthritis.She was started on Rifampin 600mg  PO Daily approx a month ago ( possibly 09/27/20 as she says she finished her first bottle on 10/27/20). She takes it everyday at 10-11am 1 hra fter breakfast and 2 hours after lunch. She denies missing any doses. Says she has a metallic taste since she started Rifampin. No new medications.  Her urine color is orange as expected. Denies any nausea/vomiting/abdominal pain and diarrhea. Appetite is good.   She is going to follow up with her Rheumatologist in next couple of weeks for starting MTX/HCQ. However, she says to  me she does not want to start those treatment. She says that she has been working too much last year and probably that is why all her joints hurt. I told her to discuss it with her Rheumatologist.  12/04/20 Taking Rifampin everyday, no missed doses. Denies side effects with Rifampin. She is taking Rifampin in between her breakfast and lunch. No new medications. Denies being on oral contraceptive pills. Discussed with the DDIs of Rifampin and need to inform me if new medications are considered.   Denies nausea/vomiting/abdominal pain and diarrhea. Metallic taste is gone. Appetite is good, no changes in weight.   She says she is not interested to start treatment on biologics for her arthritis. She says her joint pains are better compare dto before. I told her to discuss that with her Rheumatologist which she is following up soon  01/22/21 Here for follow up of latent  TB treatment. She has almost reached end of her treatment course of 4 months. No concerning side effects. She has been having issues with getting one month supply of Rifampin lately and getting 1 week supply every week instead because of pharmacy running out of Rifampin. Only missed one dose due to this. She is completing her 4 month course  in 3/11 which she is very happy about. No other complaints.   ROS: Constitutional: Negative for fever, chills, activity change, appetite change, fatigue and unexpected weight change.  HENT: Negative for congestion, sore throat, rhinorrhea, sneezing, trouble swallowing and sinus pressure.  Eyes: Negative for photophobia and visual disturbance.  Respiratory: Negative for cough, chest tightness, shortness of breath, wheezing and stridor.  Cardiovascular: Negative for chest pain, palpitations and leg swelling.  Gastrointestinal: Negative for nausea, vomiting, abdominal pain, diarrhea, constipation, blood in stool, abdominal distention and anal bleeding.  Genitourinary: Negative for dysuria, hematuria,  flank pain and difficulty urinating.  Musculoskeletal: Negative for myalgias, back pain, joint swelling, arthralgias and gait problem.  Skin: Negative for color change, pallor, rash and wound.  Neurological: Negative for dizziness, tremors, weakness and light-headedness.  Hematological: Negative for adenopathy. Does not bruise/bleed easily.  Psychiatric/Behavioral: Negative for behavioral problems, confusion, sleep disturbance, dysphoric mood, decreased concentration and agitation.   Past Medical History:  Diagnosis Date  . Eczema   . Endometriosis   . Hypertension    last 2 weeks of pregnancy - 3 years ago   . Seasonal allergies   . Thyroid goiter    bipsy done here 2013   Past Surgical History:  Procedure Laterality Date  . CESAREAN SECTION  2003/2012  . IRRIGATION AND DEBRIDEMENT ABSCESS Right 01/20/2014   Procedure: EXCISION RIGHT LOWER ABDOMINAL WALL MASS;  Surgeon: Merrie Roof, MD;  Location: [facility];  Service: General;  Laterality: Right;  . ROBOTIC ASSISTED TOTAL HYSTERECTOMY N/A 05/31/2019   Procedure: XI ROBOTIC ASSISTED TOTAL HYSTERECTOMY UTERUS GREATER THAN 250 GRAMS;  Surgeon: Everitt Amber, MD;  Location: [facility];  Service: Gynecology;  Laterality: N/A;  . XI ROBOTIC ASSISTED SALPINGECTOMY Bilateral 05/31/2019   Procedure: XI ROBOTIC ASSISTED SALPINGECTOMY AND MINI LAPAROTOMY;  Surgeon: Everitt Amber, MD;  Location: [facility];  Service: Gynecology;  Laterality: Bilateral;   Current Outpatient Medications on File Prior to Visit  Medication Sig Dispense Refill  . ibuprofen (ADVIL) 600 MG tablet Take 1 tablet (600 mg total) by mouth every 6 (six) hours as needed for moderate pain. For AFTER surgery 30 tablet 0  . rifampin (RIFADIN) 300 MG capsule Take 2 capsules (600 mg total) by mouth daily. 60 capsule 0  . albuterol (PROVENTIL) (2.5 MG/3ML) 0.083% nebulizer solution Take 2.5 mg by nebulization every 4 (four) hours as needed for wheezing or shortness of breath.  (Patient not taking:  Reported on 01/22/2021)    . oxyCODONE (OXY IR/ROXICODONE) 5 MG immediate release tablet Take 1 tablet (5 mg total) by mouth every 4 (four) hours as needed for severe pain. For AFTER surgery, do not take and drive (Patient not taking: Reported on 01/22/2021) 10 tablet 0  . senna-docusate (SENOKOT-S) 8.6-50 MG tablet Take 2 tablets by mouth at bedtime. For AFTER surgery, do not take if having diarrhea (Patient not taking: Reported on 01/22/2021) 30 tablet 0   No current facility-administered medications on file prior to visit.   Allergies  Allergen Reactions  . Shrimp [Shellfish Allergy] Anaphylaxis and Swelling  . Betadine [Povidone Iodine] Rash    Leaves rash on body  Social History   Socioeconomic History  . Marital status: Married    Spouse name: Not on file  . Number of children: Not on file  . Years of education: Not on file  . Highest education level: Not on file  Occupational History  . Not on file  Tobacco Use  . Smoking status: Never Smoker  . Smokeless tobacco: Never Used  Vaping Use  . Vaping Use: Never used  Substance and Sexual Activity  . Alcohol use: No  . Drug use: No  . Sexual activity: Yes  Other Topics Concern  . Not on file  Social History Narrative  . Not on file   Social Determinants of Health   Financial Resource Strain: Not on file  Food Insecurity: Not on file  Transportation Needs: Not on file  Physical Activity: Not on file  Stress: Not on file  Social Connections: Not on file  Intimate Partner Violence: Not on file     Vitals BP 134/84   Pulse 75   Temp 97.8 F (36.6 C) (Oral)   Ht 5\' 4"  (1.626 m)   Wt 148 lb (67.1 kg)   LMP 05/07/2019 (Approximate)   SpO2 97%   BMI 25.40 kg/m    Examination  General - not in acute distress, comfortably sitting in chair HEENT - PEERLA, no pallor and no icterus Chest - b/l clear air entry, no additional sounds CVS- Normal s1s2, RRR Abdomen - Soft, Non tender , non distended Ext- no pedal  edema Neuro: grossly normal Back - WNL Psych : calm and cooperative   Recent labs CBC Latest Ref Rng & Units 10/30/2020 05/27/2019 01/20/2014  WBC 3.8 - 10.8 Thousand/uL 5.4 7.3 6.7  Hemoglobin 11.7 - 15.5 g/dL 12.5 11.1(L) 11.5(L)  Hematocrit 35.0 - 45.0 % 39.0 36.4 34.7(L)  Platelets 140 - 400 Thousand/uL 230 252 258   CMP Latest Ref Rng & Units 12/04/2020 10/30/2020 08/20/2020  Glucose 65 - 99 mg/dL 89 85 -  BUN 7 - 25 mg/dL 10 12 -  Creatinine 0.50 - 1.10 mg/dL 0.55 0.55 -  Sodium 135 - 146 mmol/L 134(L) 138 -  Potassium 3.5 - 5.3 mmol/L 3.8 4.0 -  Chloride 98 - 110 mmol/L 102 104 -  CO2 20 - 32 mmol/L 26 29 -  Calcium 8.6 - 10.2 mg/dL 8.9 9.0 -  Total Protein 6.1 - 8.1 g/dL 8.2(H) 8.1 8.1  Total Bilirubin 0.2 - 1.2 mg/dL 0.3 0.2 0.4  Alkaline Phos 38 - 126 U/L - - -  AST 10 - 35 U/L 22 21 18   ALT 6 - 29 U/L 20 14 14      Pertinent Microbiology Results for orders placed or performed during the hospital encounter of 11/27/10  Surgical pcr screen     Status: None   Collection Time: 11/25/10  2:45 PM  Result Value Ref Range Status   MRSA, PCR NEGATIVE NEGATIVE Final   Staphylococcus aureus  NEGATIVE Final    NEGATIVE        The Xpert SA Assay (FDA approved for NASAL specimens only), is one component of a comprehensive surveillance program.  It is not intended to diagnose infection nor to guide or monitor treatment.    Pertinent Imaging All pertinent labs/Imagings/notes reviewed. All pertinent plain films and CT images have been personally visualized and interpreted; radiology reports have been reviewed. Decision making incorporated into the Impression / Recommendations.  I spent 30 minutes with the patient including  review of prior medical records with  greater than 50% of time in face to face counsel of the patient.    Electronically signed by:  Rosiland Oz, MD Infectious Disease Physician [facility] [address] [place], [place] [address] Phone: [phone]  Fax: [phone]

## 2021-01-23 LAB — COMPREHENSIVE METABOLIC PANEL
AG Ratio: 1.1 (calc) (ref 1.0–2.5)
ALT: 34 U/L — ABNORMAL HIGH (ref 6–29)
AST: 29 U/L (ref 10–35)
Albumin: 4.4 g/dL (ref 3.6–5.1)
Alkaline phosphatase (APISO): 55 U/L (ref 31–125)
BUN: 15 mg/dL (ref 7–25)
CO2: 26 mmol/L (ref 20–32)
Calcium: 9.3 mg/dL (ref 8.6–10.2)
Chloride: 103 mmol/L (ref 98–110)
Creat: 0.6 mg/dL (ref 0.50–1.10)
Globulin: 3.9 g/dL (calc) — ABNORMAL HIGH (ref 1.9–3.7)
Glucose, Bld: 82 mg/dL (ref 65–99)
Potassium: 4.2 mmol/L (ref 3.5–5.3)
Sodium: 137 mmol/L (ref 135–146)
Total Bilirubin: 0.3 mg/dL (ref 0.2–1.2)
Total Protein: 8.3 g/dL — ABNORMAL HIGH (ref 6.1–8.1)

## 2021-02-01 ENCOUNTER — Other Ambulatory Visit: Payer: Self-pay | Admitting: Infectious Diseases

## 2021-02-01 DIAGNOSIS — R7612 Nonspecific reaction to cell mediated immunity measurement of gamma interferon antigen response without active tuberculosis: Secondary | ICD-10-CM

## 2021-02-01 NOTE — Telephone Encounter (Signed)
Per Dr. Levonne Spiller last note, patient should have finished Rifampin on 01/25/21. RN spoke with patient who confirms she has completed treatment. Will deny refill request.   Beryle Flock, RN

## 2021-05-24 ENCOUNTER — Other Ambulatory Visit: Payer: Self-pay | Admitting: Family Medicine

## 2021-05-24 DIAGNOSIS — Z1231 Encounter for screening mammogram for malignant neoplasm of breast: Secondary | ICD-10-CM

## 2021-07-03 DIAGNOSIS — Z0000 Encounter for general adult medical examination without abnormal findings: Secondary | ICD-10-CM | POA: Diagnosis not present

## 2021-07-03 DIAGNOSIS — Z13220 Encounter for screening for lipoid disorders: Secondary | ICD-10-CM | POA: Diagnosis not present

## 2021-07-03 DIAGNOSIS — E559 Vitamin D deficiency, unspecified: Secondary | ICD-10-CM | POA: Diagnosis not present

## 2021-07-03 DIAGNOSIS — E049 Nontoxic goiter, unspecified: Secondary | ICD-10-CM | POA: Diagnosis not present

## 2021-07-03 DIAGNOSIS — M0609 Rheumatoid arthritis without rheumatoid factor, multiple sites: Secondary | ICD-10-CM | POA: Diagnosis not present

## 2021-07-25 ENCOUNTER — Ambulatory Visit
Admission: RE | Admit: 2021-07-25 | Discharge: 2021-07-25 | Disposition: A | Discharge status: Home / Self Care | Payer: BC Managed Care – PPO | Source: Ambulatory Visit | Attending: Family Medicine | Admitting: Family Medicine

## 2021-07-25 ENCOUNTER — Other Ambulatory Visit: Payer: Self-pay

## 2021-07-25 DIAGNOSIS — Z1231 Encounter for screening mammogram for malignant neoplasm of breast: Secondary | ICD-10-CM

## 2021-08-29 DIAGNOSIS — M1990 Unspecified osteoarthritis, unspecified site: Secondary | ICD-10-CM | POA: Diagnosis not present

## 2021-08-29 DIAGNOSIS — R768 Other specified abnormal immunological findings in serum: Secondary | ICD-10-CM | POA: Diagnosis not present

## 2021-08-29 DIAGNOSIS — M2550 Pain in unspecified joint: Secondary | ICD-10-CM | POA: Diagnosis not present

## 2021-08-29 DIAGNOSIS — R700 Elevated erythrocyte sedimentation rate: Secondary | ICD-10-CM | POA: Diagnosis not present

## 2021-11-22 IMAGING — MG MM DIGITAL SCREENING BILAT W/ TOMO AND CAD
8 series · 8 of 24 positions shown · non-contrast
Comparison: Previous exam(s).

CLINICAL DATA: Screening.

EXAM:
DIGITAL SCREENING BILATERAL MAMMOGRAM WITH TOMOSYNTHESIS AND CAD
TECHNIQUE: Bilateral screening digital craniocaudal and mediolateral oblique
mammograms were obtained. Bilateral screening digital breast
tomosynthesis was performed. The images were evaluated with
computer-aided detection.

[R CC synth-2D]
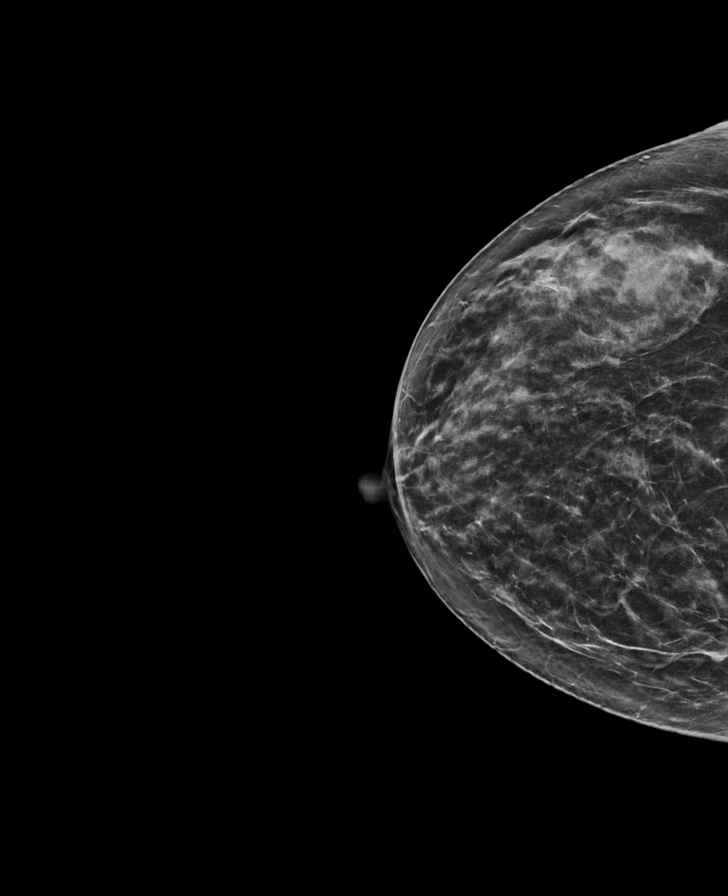

[L MLO synth-2D]
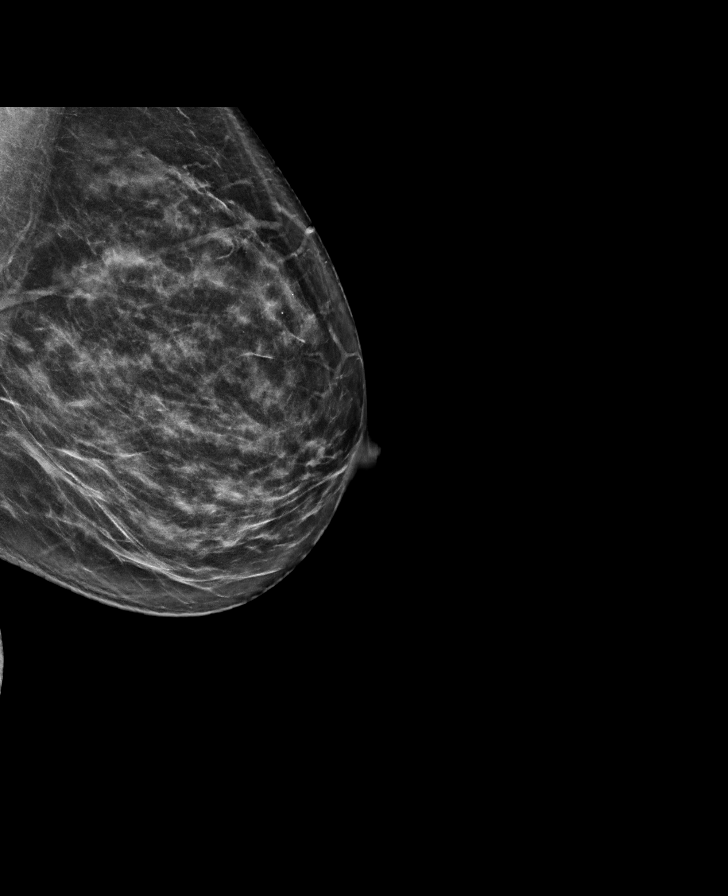

[L CC synth-2D]
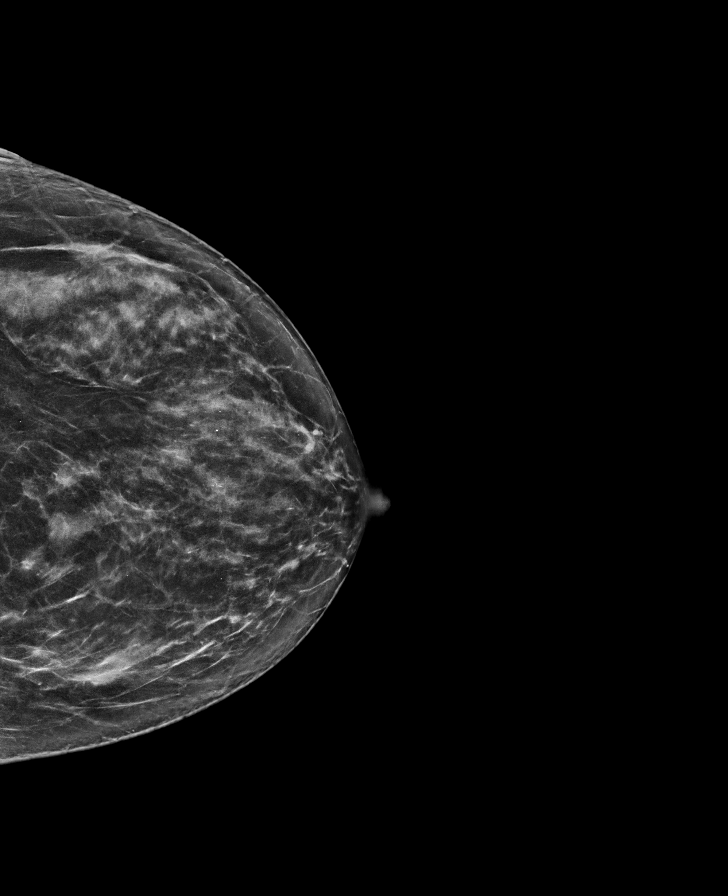

[R MLO synth-2D]
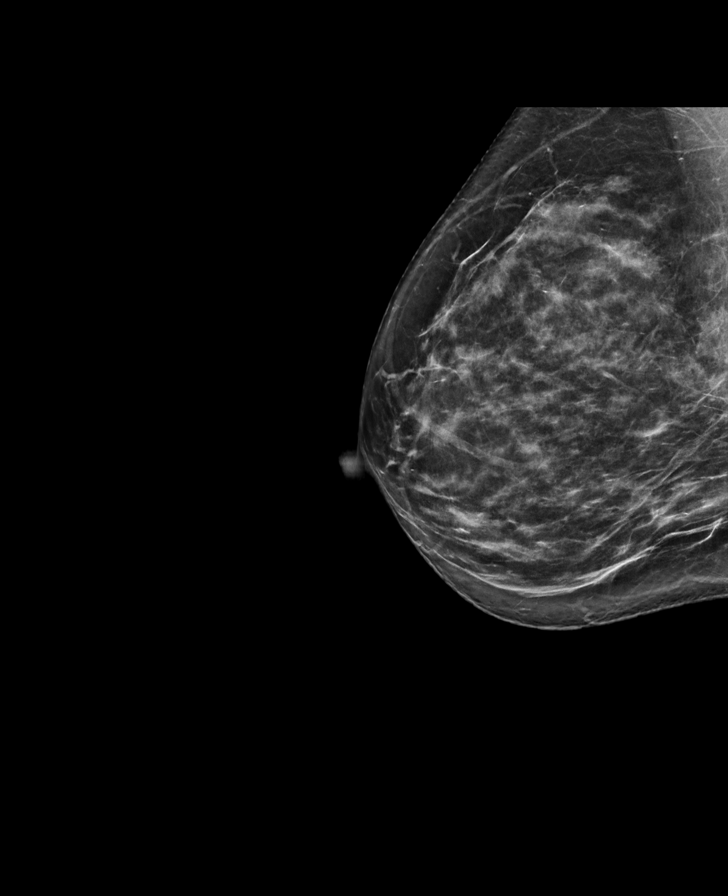

[R MLO tomo · tomo slice 37/74.0]
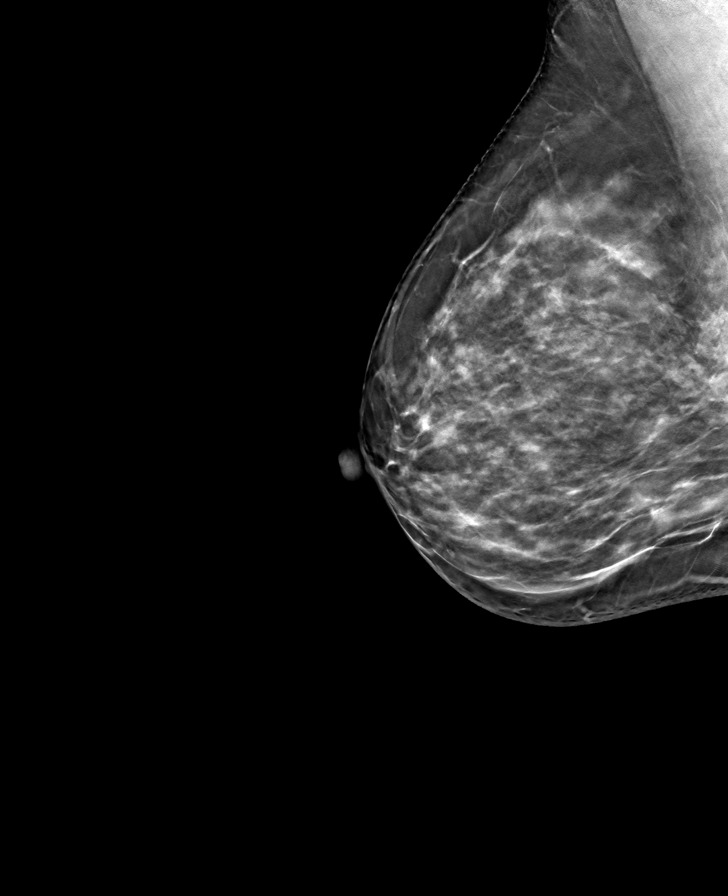

[L CC tomo · tomo slice 37/72.0]
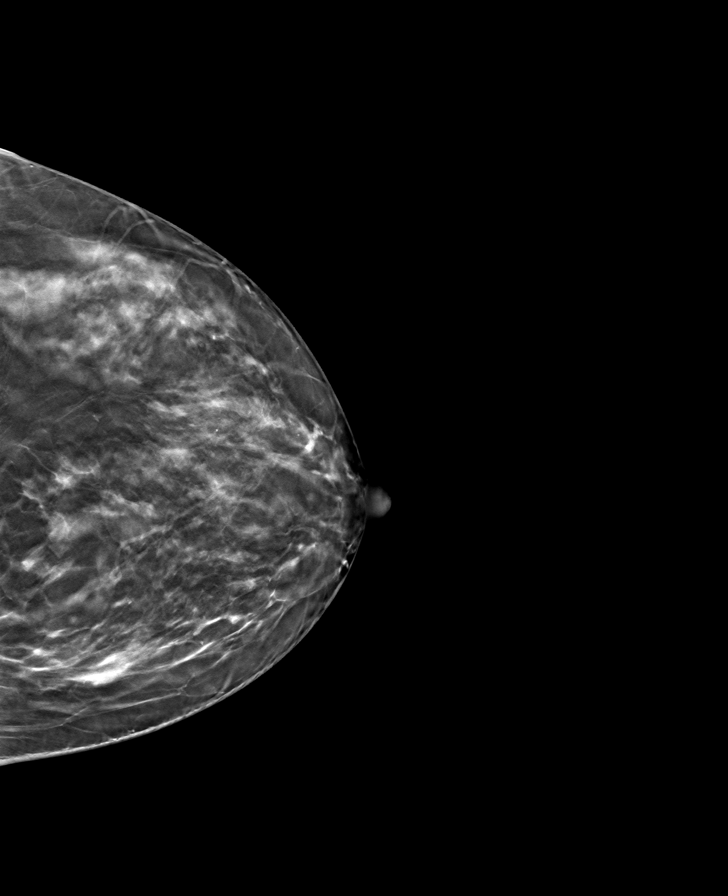

[R CC tomo · tomo slice 35/68.0]
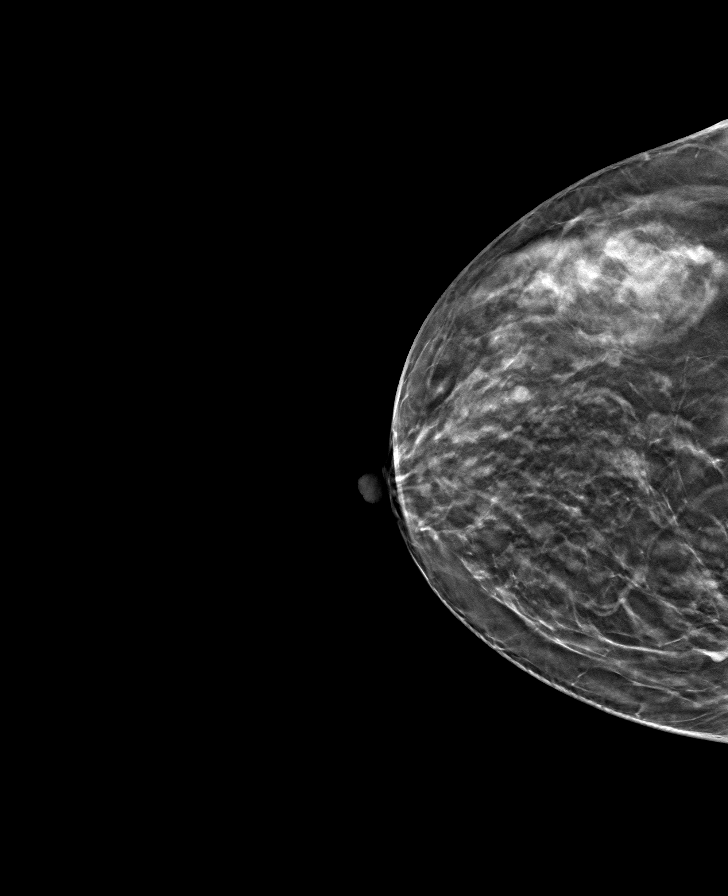

[L MLO tomo · tomo slice 39/76.0]
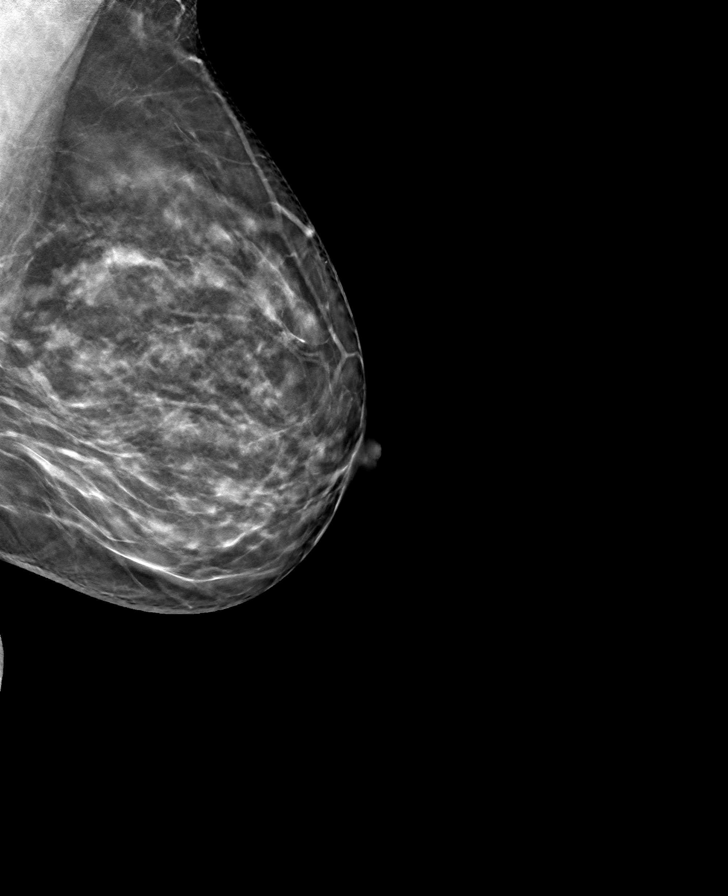

[8 of 24 positions shown; findings below may reference images not displayed]

ACR Breast Density Category c: The breast tissue is heterogeneously
dense, which may obscure small masses.
FINDINGS: There are no findings suspicious for malignancy.
IMPRESSION: No mammographic evidence of malignancy. A result letter of this
screening mammogram will be mailed directly to the patient.

RECOMMENDATION:
Screening mammogram in one year. (Code:Q3-W-BC3)

BI-RADS CATEGORY  1: Negative.

## 2022-06-16 ENCOUNTER — Other Ambulatory Visit: Payer: Self-pay | Admitting: Family Medicine

## 2022-06-16 DIAGNOSIS — Z1231 Encounter for screening mammogram for malignant neoplasm of breast: Secondary | ICD-10-CM

## 2022-07-23 DIAGNOSIS — K648 Other hemorrhoids: Secondary | ICD-10-CM | POA: Diagnosis not present

## 2022-07-23 DIAGNOSIS — Z800 Family history of malignant neoplasm of digestive organs: Secondary | ICD-10-CM | POA: Diagnosis not present

## 2022-07-23 DIAGNOSIS — D490 Neoplasm of unspecified behavior of digestive system: Secondary | ICD-10-CM | POA: Diagnosis not present

## 2022-07-23 DIAGNOSIS — Z1211 Encounter for screening for malignant neoplasm of colon: Secondary | ICD-10-CM | POA: Diagnosis not present

## 2022-07-24 ENCOUNTER — Other Ambulatory Visit (HOSPITAL_COMMUNITY): Payer: Self-pay | Admitting: Gastroenterology

## 2022-07-24 ENCOUNTER — Other Ambulatory Visit: Payer: Self-pay | Admitting: Gastroenterology

## 2022-07-24 DIAGNOSIS — K6389 Other specified diseases of intestine: Secondary | ICD-10-CM

## 2022-07-28 ENCOUNTER — Ambulatory Visit (HOSPITAL_COMMUNITY)
Admission: RE | Admit: 2022-07-28 | Discharge: 2022-07-28 | Disposition: A | Discharge status: Home / Self Care | Payer: BC Managed Care – PPO | Source: Ambulatory Visit | Attending: Gastroenterology | Admitting: Gastroenterology

## 2022-07-28 ENCOUNTER — Encounter (HOSPITAL_COMMUNITY): Payer: Self-pay

## 2022-07-28 DIAGNOSIS — K6389 Other specified diseases of intestine: Secondary | ICD-10-CM | POA: Diagnosis not present

## 2022-07-28 MED ORDER — SODIUM CHLORIDE (PF) 0.9 % IJ SOLN
INTRAMUSCULAR | Status: AC
  Filled 2022-07-28: qty 50

## 2022-07-28 MED ORDER — IOHEXOL 300 MG/ML  SOLN
100.0000 mL | Freq: Once | INTRAMUSCULAR | Status: AC | PRN
  Administered 2022-07-28: 100 mL via INTRAVENOUS

## 2022-07-29 ENCOUNTER — Ambulatory Visit
Admission: RE | Admit: 2022-07-29 | Discharge: 2022-07-29 | Disposition: A | Discharge status: Home / Self Care | Payer: BC Managed Care – PPO | Source: Ambulatory Visit | Attending: Family Medicine | Admitting: Family Medicine

## 2022-07-29 DIAGNOSIS — Z1231 Encounter for screening mammogram for malignant neoplasm of breast: Secondary | ICD-10-CM | POA: Diagnosis not present

## 2022-07-31 ENCOUNTER — Other Ambulatory Visit: Payer: Self-pay | Admitting: Family Medicine

## 2022-07-31 DIAGNOSIS — N83209 Unspecified ovarian cyst, unspecified side: Secondary | ICD-10-CM

## 2022-08-06 ENCOUNTER — Ambulatory Visit
Admission: RE | Admit: 2022-08-06 | Discharge: 2022-08-06 | Disposition: A | Discharge status: Home / Self Care | Payer: BC Managed Care – PPO | Source: Ambulatory Visit | Attending: Family Medicine | Admitting: Family Medicine

## 2022-08-06 DIAGNOSIS — N83202 Unspecified ovarian cyst, left side: Secondary | ICD-10-CM | POA: Diagnosis not present

## 2022-08-06 DIAGNOSIS — Z90710 Acquired absence of both cervix and uterus: Secondary | ICD-10-CM | POA: Diagnosis not present

## 2022-08-06 DIAGNOSIS — N83209 Unspecified ovarian cyst, unspecified side: Secondary | ICD-10-CM

## 2022-08-06 DIAGNOSIS — Z8719 Personal history of other diseases of the digestive system: Secondary | ICD-10-CM | POA: Diagnosis not present

## 2022-08-07 DIAGNOSIS — E559 Vitamin D deficiency, unspecified: Secondary | ICD-10-CM | POA: Diagnosis not present

## 2022-08-07 DIAGNOSIS — Z13220 Encounter for screening for lipoid disorders: Secondary | ICD-10-CM | POA: Diagnosis not present

## 2022-08-07 DIAGNOSIS — Z0000 Encounter for general adult medical examination without abnormal findings: Secondary | ICD-10-CM | POA: Diagnosis not present

## 2022-08-07 DIAGNOSIS — Z79899 Other long term (current) drug therapy: Secondary | ICD-10-CM | POA: Diagnosis not present

## 2022-08-07 DIAGNOSIS — E049 Nontoxic goiter, unspecified: Secondary | ICD-10-CM | POA: Diagnosis not present

## 2022-09-05 DIAGNOSIS — M0609 Rheumatoid arthritis without rheumatoid factor, multiple sites: Secondary | ICD-10-CM | POA: Diagnosis not present

## 2022-09-05 DIAGNOSIS — M25561 Pain in right knee: Secondary | ICD-10-CM | POA: Diagnosis not present

## 2022-09-23 DIAGNOSIS — H539 Unspecified visual disturbance: Secondary | ICD-10-CM | POA: Diagnosis not present

## 2022-09-23 DIAGNOSIS — M25562 Pain in left knee: Secondary | ICD-10-CM | POA: Diagnosis not present

## 2022-09-23 DIAGNOSIS — R768 Other specified abnormal immunological findings in serum: Secondary | ICD-10-CM | POA: Diagnosis not present

## 2022-09-23 DIAGNOSIS — M25561 Pain in right knee: Secondary | ICD-10-CM | POA: Diagnosis not present

## 2022-09-23 DIAGNOSIS — M25569 Pain in unspecified knee: Secondary | ICD-10-CM | POA: Diagnosis not present

## 2022-09-23 DIAGNOSIS — R700 Elevated erythrocyte sedimentation rate: Secondary | ICD-10-CM | POA: Diagnosis not present

## 2022-10-03 DIAGNOSIS — J029 Acute pharyngitis, unspecified: Secondary | ICD-10-CM | POA: Diagnosis not present

## 2022-10-05 ENCOUNTER — Ambulatory Visit
Admission: EM | Admit: 2022-10-05 | Discharge: 2022-10-05 | Disposition: A | Discharge status: Home / Self Care | Payer: BC Managed Care – PPO

## 2022-10-05 DIAGNOSIS — H66001 Acute suppurative otitis media without spontaneous rupture of ear drum, right ear: Secondary | ICD-10-CM | POA: Diagnosis not present

## 2022-10-05 MED ORDER — IBUPROFEN 800 MG PO TABS
800.0000 mg | ORAL_TABLET | Freq: Once | ORAL | Status: AC
  Administered 2022-10-05: 800 mg via ORAL

## 2022-10-05 MED ORDER — CEFDINIR 300 MG PO CAPS: 300.0000 mg | ORAL_CAPSULE | Freq: Two times a day (BID) | ORAL | 0 refills | Status: AC

## 2022-10-05 NOTE — ED Triage Notes (Signed)
Pt presents with c/o  fatigue, right ear pressure, cough, fever, chills, headache. States she recently traveled from [place]. Onset Wednesday. OTC: Flonase, vitamin C, and tylenol with no relief.   Home covid test was negative.   Was seen at PCP last week and strep was negative.

## 2022-10-05 NOTE — ED Provider Notes (Signed)
[facility]    CSN: [id number] Arrival date & time: 10/05/22  1323    HISTORY   Chief Complaint  Patient presents with   Cough   Fever   HPI Alexis Fleming is a pleasant, 51 y.o. female who presents to urgent care today. Patient complains of pain and pressure in her right ear.  Patient states she has been sick with viral infection symptoms past 5 days.  Patient states she has been tested for COVID-19 and strep throat, both test were negative.    Past Medical History:  Diagnosis Date   Eczema    Endometriosis    Hypertension    last 2 weeks of pregnancy - 3 years ago    Seasonal allergies    Thyroid goiter    bipsy done here 2013   Patient Active Problem List   Diagnosis Date Noted   Medication monitoring encounter 10/30/2020   Positive QuantiFERON-TB Gold test 08/20/2020   Pelvic mass 04/08/2019   Intramural and subserous leiomyoma of uterus 04/08/2019   Abdominal mass, RUQ (right upper quadrant) 12/28/2013   Past Surgical History:  Procedure Laterality Date   CESAREAN SECTION  2003/2012   IRRIGATION AND DEBRIDEMENT ABSCESS Right 01/20/2014   Procedure: EXCISION RIGHT LOWER ABDOMINAL WALL MASS;  Surgeon: Merrie Roof, MD;  Location: [facility];  Service: General;  Laterality: Right;   ROBOTIC ASSISTED TOTAL HYSTERECTOMY N/A 05/31/2019   Procedure: XI ROBOTIC ASSISTED TOTAL HYSTERECTOMY UTERUS GREATER THAN 250 GRAMS;  Surgeon: Everitt Amber, MD;  Location: [facility];  Service: Gynecology;  Laterality: N/A;   XI ROBOTIC ASSISTED SALPINGECTOMY Bilateral 05/31/2019   Procedure: XI ROBOTIC ASSISTED SALPINGECTOMY AND MINI LAPAROTOMY;  Surgeon: Everitt Amber, MD;  Location: [facility];  Service: Gynecology;  Laterality: Bilateral;   OB History   No obstetric history on file.    Home Medications    Prior to Admission medications   Not on File    Family History Family History  Problem Relation Age of Onset   Colon cancer Mother    Heart disease Father    Breast  cancer Cousin    Social History Social History   Tobacco Use   Smoking status: Never   Smokeless tobacco: Never  Vaping Use   Vaping Use: Never used  Substance Use Topics   Alcohol use: No   Drug use: No   Allergies   Shrimp [shellfish allergy], Augmentin [amoxicillin-pot clavulanate], and Betadine [povidone iodine]  Review of Systems Review of Systems Pertinent findings revealed after performing a 14 point review of systems has been noted in the history of present illness.  Physical Exam Triage Vital Signs ED Triage Vitals  Enc Vitals Group     BP 09/13/21 0827 (!) 147/82     Pulse Rate 09/13/21 0827 72     Resp 09/13/21 0827 18     Temp 09/13/21 0827 98.3 F (36.8 C)     Temp Source 09/13/21 0827 Oral     SpO2 09/13/21 0827 98 %     Weight --      Height --      Head Circumference --      Peak Flow --      Pain Score 09/13/21 0826 5     Pain Loc --      Pain Edu? --      Excl. in [place]? --   No data found.  Updated Vital Signs BP 135/87 (BP Location: Left Arm)   Pulse 93  Temp 100.2 F (37.9 C) (Oral)   Resp 16   LMP 05/07/2019 (Approximate)   SpO2 96%   Physical Exam  Visual Acuity Right Eye Distance:   Left Eye Distance:   Bilateral Distance:    Right Eye Near:   Left Eye Near:    Bilateral Near:     UC Couse / Diagnostics / Procedures:     Radiology No results found.  Procedures Procedures (including critical care time) EKG  Pending results:  Labs Reviewed - No data to display  Medications Ordered in UC: Medications  ibuprofen (ADVIL) tablet 800 mg (has no administration in time range)    UC Diagnoses / Final Clinical Impressions(s)   I have reviewed the triage vital signs and the nursing notes.  Pertinent labs & imaging results that were available during my care of the patient were reviewed by me and considered in my medical decision making (see chart for details).    Final diagnoses:  Acute suppurative otitis media of right  ear without spontaneous rupture of tympanic membrane, recurrence not specified   ***  ED Prescriptions     Medication Sig Dispense Auth. Provider   cefdinir (OMNICEF) 300 MG capsule Take 1 capsule (300 mg total) by mouth 2 (two) times daily for 10 days. 20 capsule Lynden Oxford Scales, PA-C      PDMP not reviewed this encounter.  Disposition Upon Discharge:  Condition: stable for discharge home Home: take medications as prescribed; routine discharge instructions as discussed; follow up as advised.  Patient presented with an acute illness with associated systemic symptoms and significant discomfort requiring urgent management. In my opinion, this is a condition that a prudent lay person (someone who possesses an average knowledge of health and medicine) may potentially expect to result in complications if not addressed urgently such as respiratory distress, impairment of bodily function or dysfunction of bodily organs.   Routine symptom specific, illness specific and/or disease specific instructions were discussed with the patient and/or caregiver at length.   As such, the patient has been evaluated and assessed, work-up was performed and treatment was provided in alignment with urgent care protocols and evidence based medicine.  Patient/parent/caregiver has been advised that the patient may require follow up for further testing and treatment if the symptoms continue in spite of treatment, as clinically indicated and appropriate.  If the patient was tested for COVID-19, Influenza and/or RSV, then the patient/parent/guardian was advised to isolate at home pending the results of his/her diagnostic coronavirus test and potentially longer if they're positive. I have also advised pt that if his/her COVID-19 test returns positive, it's recommended to self-isolate for at least 10 days after symptoms first appeared AND until fever-free for 24 hours without fever reducer AND other symptoms have  improved or resolved. Discussed self-isolation recommendations as well as instructions for household member/close contacts as per the [facility] and [place] [facility], and also gave patient the [facility] packet with this information.  Patient/parent/caregiver has been advised to return to the [facility] or PCP in 3-5 days if no better; to PCP or the Emergency Department if new signs and symptoms develop, or if the current signs or symptoms continue to change or worsen for further workup, evaluation and treatment as clinically indicated and appropriate  The patient will follow up with their current PCP if and as advised. If the patient does not currently have a PCP we will assist them in obtaining one.   The patient may need specialty follow up if the  symptoms continue, in spite of conservative treatment and management, for further workup, evaluation, consultation and treatment as clinically indicated and appropriate.  Patient/parent/caregiver verbalized understanding and agreement of plan as discussed.  All questions were addressed during visit.  Please see discharge instructions below for further details of plan.  Discharge Instructions:   Discharge Instructions      Please read below to learn more about the medications, dosages and frequencies that I recommend to help alleviate your symptoms and to get you feeling better soon:   Omnicef (cefdinir):  1 capsule twice daily for 10 days, you can take it with or without food.  This antibiotic can cause upset stomach, this will resolve once antibiotics are complete.  You are welcome to use a probiotic, eat yogurt, take Imodium while taking this medication.  Please avoid other systemic medications such as Maalox, Pepto-Bismol or milk of magnesia as they can interfere with your body's ability to absorb the antibiotics.   Advil, Motrin (ibuprofen): This is a good anti-inflammatory medication which addresses aches, pains and inflammation of the upper airways that causes sinus and  nasal congestion as well as in the lower airways which makes your cough feel tight and sometimes burn.  I recommend that you take between 400 to 600 mg every 6-8 hours as needed.      Please follow-up within the next 7-10 days either with your primary care provider or urgent care if your symptoms do not resolve.  If you do not have a primary care provider, we will assist you in finding one.        Thank you for visiting urgent care today.  We appreciate the opportunity to participate in your care.         This office note has been dictated using Museum/gallery curator.  Unfortunately, this method of dictation can sometimes lead to typographical or grammatical errors.  I apologize for your inconvenience in advance if this occurs.  Please do not hesitate to reach out to me if clarification is needed.

## 2022-10-05 NOTE — Discharge Instructions (Signed)
Please read below to learn more about the medications, dosages and frequencies that I recommend to help alleviate your symptoms and to get you feeling better soon:   Omnicef (cefdinir):  1 capsule twice daily for 10 days, you can take it with or without food.  This antibiotic can cause upset stomach, this will resolve once antibiotics are complete.  You are welcome to use a probiotic, eat yogurt, take Imodium while taking this medication.  Please avoid other systemic medications such as Maalox, Pepto-Bismol or milk of magnesia as they can interfere with your body's ability to absorb the antibiotics.   Advil, Motrin (ibuprofen): This is a good anti-inflammatory medication which addresses aches, pains and inflammation of the upper airways that causes sinus and nasal congestion as well as in the lower airways which makes your cough feel tight and sometimes burn.  I recommend that you take between 400 to 600 mg every 6-8 hours as needed.      Please follow-up within the next 7-10 days either with your primary care provider or urgent care if your symptoms do not resolve.  If you do not have a primary care provider, we will assist you in finding one.        Thank you for visiting urgent care today.  We appreciate the opportunity to participate in your care.

## 2022-10-14 DIAGNOSIS — R059 Cough, unspecified: Secondary | ICD-10-CM | POA: Diagnosis not present

## 2022-10-14 DIAGNOSIS — J45909 Unspecified asthma, uncomplicated: Secondary | ICD-10-CM | POA: Diagnosis not present

## 2022-10-14 DIAGNOSIS — M0609 Rheumatoid arthritis without rheumatoid factor, multiple sites: Secondary | ICD-10-CM | POA: Diagnosis not present

## 2022-10-23 ENCOUNTER — Other Ambulatory Visit: Payer: Self-pay | Admitting: Family Medicine

## 2022-10-23 ENCOUNTER — Ambulatory Visit
Admission: RE | Admit: 2022-10-23 | Discharge: 2022-10-23 | Disposition: A | Discharge status: Home / Self Care | Payer: BC Managed Care – PPO | Source: Ambulatory Visit | Attending: Family Medicine | Admitting: Family Medicine

## 2022-10-23 DIAGNOSIS — R0602 Shortness of breath: Secondary | ICD-10-CM

## 2022-10-23 DIAGNOSIS — R059 Cough, unspecified: Secondary | ICD-10-CM | POA: Diagnosis not present

## 2022-11-19 DIAGNOSIS — M25562 Pain in left knee: Secondary | ICD-10-CM | POA: Diagnosis not present

## 2022-11-19 DIAGNOSIS — M25561 Pain in right knee: Secondary | ICD-10-CM | POA: Diagnosis not present

## 2022-11-19 DIAGNOSIS — R700 Elevated erythrocyte sedimentation rate: Secondary | ICD-10-CM | POA: Diagnosis not present

## 2022-11-19 DIAGNOSIS — R768 Other specified abnormal immunological findings in serum: Secondary | ICD-10-CM | POA: Diagnosis not present

## 2023-07-10 ENCOUNTER — Other Ambulatory Visit: Payer: Self-pay | Admitting: Family Medicine

## 2023-07-10 DIAGNOSIS — Z1231 Encounter for screening mammogram for malignant neoplasm of breast: Secondary | ICD-10-CM

## 2023-07-23 DIAGNOSIS — M25562 Pain in left knee: Secondary | ICD-10-CM | POA: Diagnosis not present

## 2023-07-23 DIAGNOSIS — R768 Other specified abnormal immunological findings in serum: Secondary | ICD-10-CM | POA: Diagnosis not present

## 2023-07-23 DIAGNOSIS — M25561 Pain in right knee: Secondary | ICD-10-CM | POA: Diagnosis not present

## 2023-07-23 DIAGNOSIS — R700 Elevated erythrocyte sedimentation rate: Secondary | ICD-10-CM | POA: Diagnosis not present

## 2023-08-20 DIAGNOSIS — Z13220 Encounter for screening for lipoid disorders: Secondary | ICD-10-CM | POA: Diagnosis not present

## 2023-08-20 DIAGNOSIS — E049 Nontoxic goiter, unspecified: Secondary | ICD-10-CM | POA: Diagnosis not present

## 2023-08-20 DIAGNOSIS — E559 Vitamin D deficiency, unspecified: Secondary | ICD-10-CM | POA: Diagnosis not present

## 2023-08-20 DIAGNOSIS — D649 Anemia, unspecified: Secondary | ICD-10-CM | POA: Diagnosis not present

## 2023-08-20 DIAGNOSIS — Z131 Encounter for screening for diabetes mellitus: Secondary | ICD-10-CM | POA: Diagnosis not present

## 2023-08-20 DIAGNOSIS — Z0000 Encounter for general adult medical examination without abnormal findings: Secondary | ICD-10-CM | POA: Diagnosis not present

## 2023-10-22 DIAGNOSIS — R700 Elevated erythrocyte sedimentation rate: Secondary | ICD-10-CM | POA: Diagnosis not present

## 2023-10-26 ENCOUNTER — Ambulatory Visit: Payer: BC Managed Care – PPO

## 2023-11-04 DIAGNOSIS — J029 Acute pharyngitis, unspecified: Secondary | ICD-10-CM | POA: Diagnosis not present

## 2023-11-04 DIAGNOSIS — J069 Acute upper respiratory infection, unspecified: Secondary | ICD-10-CM | POA: Diagnosis not present

## 2023-11-17 ENCOUNTER — Ambulatory Visit
Admission: RE | Admit: 2023-11-17 | Discharge: 2023-11-17 | Disposition: A | Discharge status: Home / Self Care | Payer: BC Managed Care – PPO | Source: Ambulatory Visit | Attending: Family Medicine | Admitting: Family Medicine

## 2023-11-17 DIAGNOSIS — Z1231 Encounter for screening mammogram for malignant neoplasm of breast: Secondary | ICD-10-CM | POA: Diagnosis not present

## 2024-08-09 ENCOUNTER — Other Ambulatory Visit: Payer: Self-pay | Admitting: Family Medicine

## 2024-08-09 DIAGNOSIS — Z0000 Encounter for general adult medical examination without abnormal findings: Secondary | ICD-10-CM

## 2024-10-17 DIAGNOSIS — Z0000 Encounter for general adult medical examination without abnormal findings: Secondary | ICD-10-CM | POA: Diagnosis not present

## 2024-10-17 DIAGNOSIS — E049 Nontoxic goiter, unspecified: Secondary | ICD-10-CM | POA: Diagnosis not present

## 2024-10-17 DIAGNOSIS — E7800 Pure hypercholesterolemia, unspecified: Secondary | ICD-10-CM | POA: Diagnosis not present

## 2024-10-17 DIAGNOSIS — J45909 Unspecified asthma, uncomplicated: Secondary | ICD-10-CM | POA: Diagnosis not present

## 2024-10-17 DIAGNOSIS — M2550 Pain in unspecified joint: Secondary | ICD-10-CM | POA: Diagnosis not present

## 2024-10-17 DIAGNOSIS — Z23 Encounter for immunization: Secondary | ICD-10-CM | POA: Diagnosis not present

## 2024-12-21 ENCOUNTER — Other Ambulatory Visit: Payer: Self-pay | Admitting: Family Medicine

## 2024-12-21 ENCOUNTER — Ambulatory Visit
Admission: RE | Admit: 2024-12-21 | Discharge: 2024-12-21 | Disposition: A | Source: Ambulatory Visit | Attending: Family Medicine | Admitting: Family Medicine

## 2024-12-21 DIAGNOSIS — Z0000 Encounter for general adult medical examination without abnormal findings: Secondary | ICD-10-CM
